# Patient Record
Sex: Female | Born: 1955 | Race: White | Hispanic: No | Smoking: Never smoker
Health system: Southern US, Community
[De-identification: ages and names within clinical notes are randomized; demographics above are authoritative.]

## PROBLEM LIST (undated history)

## (undated) DIAGNOSIS — E7800 Pure hypercholesterolemia, unspecified: Secondary | ICD-10-CM

## (undated) DIAGNOSIS — D649 Anemia, unspecified: Secondary | ICD-10-CM

## (undated) HISTORY — PX: WISDOM TOOTH EXTRACTION: SHX21

## (undated) HISTORY — DX: Pure hypercholesterolemia, unspecified: E78.00

## (undated) HISTORY — DX: Anemia, unspecified: D64.9

---

## 1997-07-12 ENCOUNTER — Other Ambulatory Visit: Admission: RE | Admit: 1997-07-12 | Discharge: 1997-07-12 | Payer: Self-pay | Admitting: Obstetrics and Gynecology

## 1998-08-10 ENCOUNTER — Other Ambulatory Visit: Admission: RE | Admit: 1998-08-10 | Discharge: 1998-08-10 | Payer: Self-pay | Admitting: Obstetrics and Gynecology

## 1999-08-15 ENCOUNTER — Other Ambulatory Visit: Admission: RE | Admit: 1999-08-15 | Discharge: 1999-08-15 | Payer: Self-pay | Admitting: Obstetrics and Gynecology

## 2000-08-21 ENCOUNTER — Other Ambulatory Visit: Admission: RE | Admit: 2000-08-21 | Discharge: 2000-08-21 | Payer: Self-pay | Admitting: Obstetrics and Gynecology

## 2002-09-29 ENCOUNTER — Other Ambulatory Visit: Admission: RE | Admit: 2002-09-29 | Discharge: 2002-09-29 | Payer: Self-pay | Admitting: Family Medicine

## 2003-11-28 ENCOUNTER — Other Ambulatory Visit: Admission: RE | Admit: 2003-11-28 | Discharge: 2003-11-28 | Payer: Self-pay | Admitting: Family Medicine

## 2004-05-06 ENCOUNTER — Ambulatory Visit: Payer: Self-pay | Admitting: Unknown Physician Specialty

## 2005-01-14 ENCOUNTER — Ambulatory Visit: Payer: Self-pay | Admitting: Family Medicine

## 2005-01-14 ENCOUNTER — Other Ambulatory Visit: Admission: RE | Admit: 2005-01-14 | Discharge: 2005-01-14 | Payer: Self-pay | Admitting: Family Medicine

## 2006-04-27 ENCOUNTER — Ambulatory Visit: Payer: Self-pay | Admitting: Family Medicine

## 2006-04-27 ENCOUNTER — Other Ambulatory Visit: Admission: RE | Admit: 2006-04-27 | Discharge: 2006-04-27 | Payer: Self-pay | Admitting: Family Medicine

## 2006-05-20 ENCOUNTER — Ambulatory Visit: Payer: Self-pay | Admitting: Family Medicine

## 2010-07-17 ENCOUNTER — Ambulatory Visit: Payer: Self-pay | Admitting: Unknown Physician Specialty

## 2010-07-25 ENCOUNTER — Ambulatory Visit: Payer: Self-pay | Admitting: Unknown Physician Specialty

## 2015-12-17 LAB — HM PAP SMEAR: HM Pap smear: NEGATIVE

## 2016-02-28 DIAGNOSIS — L821 Other seborrheic keratosis: Secondary | ICD-10-CM | POA: Diagnosis not present

## 2016-04-09 ENCOUNTER — Other Ambulatory Visit: Payer: Self-pay | Admitting: *Deleted

## 2016-04-09 ENCOUNTER — Encounter: Payer: Self-pay | Admitting: Podiatry

## 2016-04-09 ENCOUNTER — Ambulatory Visit (INDEPENDENT_AMBULATORY_CARE_PROVIDER_SITE_OTHER): Payer: BC Managed Care – PPO

## 2016-04-09 ENCOUNTER — Ambulatory Visit (INDEPENDENT_AMBULATORY_CARE_PROVIDER_SITE_OTHER): Payer: BC Managed Care – PPO | Admitting: Podiatry

## 2016-04-09 ENCOUNTER — Encounter: Payer: Self-pay | Admitting: *Deleted

## 2016-04-09 VITALS — BP 133/77 | HR 81 | Resp 16 | Ht 62.0 in | Wt 130.0 lb

## 2016-04-09 DIAGNOSIS — M79671 Pain in right foot: Secondary | ICD-10-CM

## 2016-04-09 DIAGNOSIS — M722 Plantar fascial fibromatosis: Secondary | ICD-10-CM

## 2016-04-09 DIAGNOSIS — M79672 Pain in left foot: Secondary | ICD-10-CM | POA: Diagnosis not present

## 2016-04-09 DIAGNOSIS — M205X1 Other deformities of toe(s) (acquired), right foot: Secondary | ICD-10-CM

## 2016-04-09 DIAGNOSIS — E785 Hyperlipidemia, unspecified: Secondary | ICD-10-CM | POA: Insufficient documentation

## 2016-04-09 MED ORDER — MELOXICAM 15 MG PO TABS: 15.0000 mg | ORAL_TABLET | Freq: Every day | ORAL | 3 refills | Status: DC

## 2016-04-09 NOTE — Progress Notes (Signed)
   Subjective:    Patient ID: Lindsay Randall, female    DOB: [birth year], 61 y.o.   MRN: [id number]  HPI: She presents today with a chief complaint of pain throughout the plantar aspect of the bilateral foot. She really can't pinpoint the pain but thinks it started in the heels. She states that the pain is in different areas and he may just be arthritis as I have arthritis everywhere. She states the more she walks the worse it gets she's tried arthritis creams no medications other than that. She does not like to take medications.    Review of Systems  Constitutional: Positive for chills and diaphoresis.  Genitourinary: Positive for frequency and urgency.  Musculoskeletal: Positive for arthralgias, back pain, gait problem and myalgias.  Neurological: Positive for dizziness.  All other systems reviewed and are negative.      Objective:   Physical Exam: Vital signs are stable alert and oriented 3 pulses are palpable. Neurologic sensorium is intact per Semmes-Weinstein monofilament. Deep tendon reflexes are intact. Muscle strength was 5 over 5 dorsiflexion plantar flexors and inverters and everters all into the musculature is intact. She has tenderness on palpation medial calcaneal tubercles bilaterally and limited range of motion of the first metatarsophalangeal joint of the right foot. She has tenderness on palpation of the first metatarsophalangeal joint of the right foot and attempted range of motion. Radiographs taken today do demonstrate soft tissue increase in density of the plantar fascia calcaneal insertion site of the bilateral heels. And also hallux rigidus first metatarsophalangeal joint. Joint space narrowing subchondral sclerosis and dorsal spurring is noted.        Assessment & Plan:  Assessment: Hallux rigidus first metatarsophalangeal joint and plantar fasciitis bilateral with compensatory syndrome.  Plan: Discussed etiology pathology conservative versus surgical therapies. I  offered her an injection and orthotics and she declined. We discussed the possible need for surgical intervention regarding the first metatarsophalangeal joint. I did talk with her extensively about taking an oral anti-inflammatory which she finally decided to do and we will start her on meloxicam 15 mg 1 by mouth daily 30 with 1 refill. Follow-up with her in 1 month.

## 2016-06-09 ENCOUNTER — Ambulatory Visit (INDEPENDENT_AMBULATORY_CARE_PROVIDER_SITE_OTHER): Payer: BC Managed Care – PPO | Admitting: Podiatry

## 2016-06-09 ENCOUNTER — Encounter: Payer: Self-pay | Admitting: Podiatry

## 2016-06-09 DIAGNOSIS — M779 Enthesopathy, unspecified: Secondary | ICD-10-CM

## 2016-06-09 DIAGNOSIS — M722 Plantar fascial fibromatosis: Secondary | ICD-10-CM | POA: Diagnosis not present

## 2016-06-09 MED ORDER — DICLOFENAC SODIUM 1 % TD GEL: 4.0000 g | Freq: Four times a day (QID) | TRANSDERMAL | 3 refills | Status: DC

## 2016-06-09 NOTE — Progress Notes (Signed)
She presents today for follow-up plantar foot pain beneath the second metatarsal stasis burning under the toe she's also following up for bilateral plantar fasciitis states they're better but they still bother me to some degree.  Objective: Vital signs are stable she's alert and oriented 3. Pulses are palpable. She has tennis on palpation medially continued to both bilateral heels and beneath the second metatarsophalangeal joint.  Assessment: Plantar fasciitis and capsulitis. Discussed appropriate shoe gear stretching exercise ice therapy issue modifications. Also discussed with her in great detail today about using diclofenac gel and she will start utilizing that. Follow up with me as needed.

## 2016-06-24 DIAGNOSIS — D509 Iron deficiency anemia, unspecified: Secondary | ICD-10-CM | POA: Diagnosis not present

## 2016-06-24 DIAGNOSIS — E784 Other hyperlipidemia: Secondary | ICD-10-CM | POA: Diagnosis not present

## 2016-06-24 DIAGNOSIS — Z0000 Encounter for general adult medical examination without abnormal findings: Secondary | ICD-10-CM | POA: Diagnosis not present

## 2016-08-07 ENCOUNTER — Other Ambulatory Visit: Payer: Self-pay

## 2016-08-07 MED ORDER — MELOXICAM 15 MG PO TABS: 15.0000 mg | ORAL_TABLET | Freq: Every day | ORAL | 3 refills | Status: DC

## 2016-08-07 NOTE — Progress Notes (Signed)
Pharmacy requesting refill authorization for Mobic.  Per Dr. Milinda Pointer, ok to refill x3    Rx has been sent to pharmacy

## 2016-11-11 DIAGNOSIS — M7042 Prepatellar bursitis, left knee: Secondary | ICD-10-CM | POA: Diagnosis not present

## 2016-12-04 ENCOUNTER — Telehealth: Payer: Self-pay

## 2016-12-04 MED ORDER — MELOXICAM 15 MG PO TABS: 15.0000 mg | ORAL_TABLET | Freq: Every day | ORAL | 6 refills | Status: DC

## 2016-12-04 NOTE — Telephone Encounter (Signed)
Pharmacy refill request for Meloxicam.  Per Dr. Milinda Pointer, refills have been sent back to pharmacy.

## 2016-12-15 DIAGNOSIS — M25562 Pain in left knee: Secondary | ICD-10-CM | POA: Diagnosis not present

## 2016-12-15 DIAGNOSIS — N951 Menopausal and female climacteric states: Secondary | ICD-10-CM | POA: Diagnosis not present

## 2017-02-26 ENCOUNTER — Ambulatory Visit: Payer: BC Managed Care – PPO | Admitting: Obstetrics and Gynecology

## 2017-03-02 DIAGNOSIS — Z1283 Encounter for screening for malignant neoplasm of skin: Secondary | ICD-10-CM | POA: Diagnosis not present

## 2017-03-02 DIAGNOSIS — S0501XA Injury of conjunctiva and corneal abrasion without foreign body, right eye, initial encounter: Secondary | ICD-10-CM | POA: Diagnosis not present

## 2017-03-02 DIAGNOSIS — D229 Melanocytic nevi, unspecified: Secondary | ICD-10-CM | POA: Diagnosis not present

## 2017-03-02 DIAGNOSIS — L720 Epidermal cyst: Secondary | ICD-10-CM | POA: Diagnosis not present

## 2017-03-06 ENCOUNTER — Ambulatory Visit (INDEPENDENT_AMBULATORY_CARE_PROVIDER_SITE_OTHER): Payer: BC Managed Care – PPO | Admitting: Obstetrics and Gynecology

## 2017-03-06 ENCOUNTER — Encounter: Payer: Self-pay | Admitting: Obstetrics and Gynecology

## 2017-03-06 VITALS — BP 120/80 | HR 78 | Ht 62.0 in | Wt 129.0 lb

## 2017-03-06 DIAGNOSIS — Z01419 Encounter for gynecological examination (general) (routine) without abnormal findings: Secondary | ICD-10-CM

## 2017-03-06 DIAGNOSIS — Z124 Encounter for screening for malignant neoplasm of cervix: Secondary | ICD-10-CM

## 2017-03-06 LAB — HM PAP SMEAR: HM Pap smear: NEGATIVE

## 2017-03-06 NOTE — Progress Notes (Signed)
Routine Annual Gynecology Examination   PCP: Maryland Pink, MD  Chief Complaint:  Chief Complaint  Patient presents with  . Gynecologic Exam    buring c peeing ?skin irritation    History of Present Illness: Patient is a 62 y.o. G1P1001 presents for annual exam. The patient has no complaints today.   Menopausal bleeding: denies  Menopausal symptoms: reports mild symptoms. Started Effexor and gets mild relief, but not as good as with hormones.   Breast symptoms: denies  Last pap smear: 1.5 years ago.  Result Normal  Last mammogram: April  2017 in [place] per patient (report not available) years ago.  Result Normal   Routine screening and vaccination maintenance by PCP and endocrinologist.   Past Medical History:  Diagnosis Date  . Anemia   . Hypercholesteremia     Past Surgical History:  Procedure Laterality Date  . WISDOM TOOTH EXTRACTION      Prior to Admission medications   Medication Sig Start Date End Date Taking? Authorizing Provider  ezetimibe-simvastatin (VYTORIN) 10-40 MG tablet Take 1 tablet by mouth daily. 02/04/16  Yes [provider]  venlafaxine XR (EFFEXOR-XR) 37.5 MG 24 hr capsule Take 1 capsule by mouth 2 (two) times daily. 02/09/17  Yes [provider]   Allergies: No Known Allergies  Obstetric History: G1P1001  Social History   Socioeconomic History  . Marital status: Married    Spouse name: Not on file  . Number of children: Not on file  . Years of education: Not on file  . Highest education level: Not on file  Social Needs  . Financial resource strain: Not on file  . Food insecurity - worry: Not on file  . Food insecurity - inability: Not on file  . Transportation needs - medical: Not on file  . Transportation needs - non-medical: Not on file  Occupational History  . Not on file  Tobacco Use  . Smoking status: Never Smoker  . Smokeless tobacco: Never Used  Substance and Sexual Activity  . Alcohol use: Yes      Comment: rare  . Drug use: No  . Sexual activity: No    Birth control/protection: Post-menopausal  Other Topics Concern  . Not on file  Social History Narrative  . Not on file    Family History  Problem Relation Age of Onset  . Diabetes Mellitus II Mother   . Heart disease Mother   . Multiple sclerosis Father     Review of Systems  Constitutional: Negative.   HENT: Negative.   Eyes: Negative.   Respiratory: Negative.   Cardiovascular: Negative.   Gastrointestinal: Negative.   Genitourinary: Negative.   Musculoskeletal: Negative.   Skin: Negative.   Neurological: Negative.   Psychiatric/Behavioral: Negative.      Physical Exam Vitals: BP 120/80   Pulse 78   Ht 5\' 2"  (1.575 m)   Wt 129 lb (58.5 kg)   BMI 23.59 kg/m   Physical Exam  Constitutional: She is oriented to person, place, and time. She appears well-developed and well-nourished. No distress.  Genitourinary: Uterus normal. Pelvic exam was performed with patient supine. There is no rash, tenderness, lesion or injury on the right labia. There is no rash, tenderness, lesion or injury on the left labia. No erythema, tenderness or bleeding in the vagina. No signs of injury around the vagina. No vaginal discharge found. Right adnexum does not display mass, does not display tenderness and does not display fullness. Left adnexum does not display mass,  does not display tenderness and does not display fullness. Cervix does not exhibit motion tenderness, lesion, discharge or polyp.   Uterus is mobile and anteverted. Uterus is not enlarged, tender or exhibiting a mass.  HENT:  Head: Normocephalic and atraumatic.  Eyes: EOM are normal. No scleral icterus.  Neck: Normal range of motion. Neck supple. No thyromegaly present.  Cardiovascular: Normal rate and regular rhythm. Exam reveals no gallop and no friction rub.  No murmur heard. Pulmonary/Chest: Effort normal and breath sounds normal. No respiratory distress. She has no  wheezes. She has no rales. Right breast exhibits no inverted nipple, no mass, no nipple discharge, no skin change and no tenderness. Left breast exhibits no inverted nipple, no mass, no nipple discharge, no skin change and no tenderness.  Abdominal: Soft. Bowel sounds are normal. She exhibits no distension and no mass. There is no tenderness. There is no rebound and no guarding.  Musculoskeletal: Normal range of motion. She exhibits no edema or tenderness.  Lymphadenopathy:    She has no cervical adenopathy.       Right: No inguinal adenopathy present.       Left: No inguinal adenopathy present.  Neurological: She is alert and oriented to person, place, and time. No cranial nerve deficit.  Skin: Skin is warm and dry. No rash noted. No erythema.  Psychiatric: She has a normal mood and affect. Her behavior is normal. Judgment normal.   Female chaperone present for pelvic and breast  portions of the physical exam   Assessment and Plan:  62 y.o. G21P1001 female here for routine annual gynecologic examination  Plan: Problem List Items Addressed This Visit    None      Screening: -- Blood pressure screen normal -- Colonoscopy - per PCP -- Mammogram - not due. patient to schedule in April. I have requested that the patient ask [facility] to send me a copy of the report since I perform her breast exams -- Weight screening: normal -- Depression screening negative (PHQ-9) -- Nutrition: normal -- cholesterol screening: per PCP -- osteoporosis screening: not due -- tobacco screening: not using -- alcohol screening: AUDIT questionnaire indicates low-risk usage. -- family history of breast cancer screening: done. not at high risk. -- no evidence of domestic violence or intimate partner violence. -- STD screening: gonorrhea/chlamydia NAAT not collected per patient request. -- pap smear collected per ASCCP guidelines -- HPV vaccination series: not eligilbe  Prentice Docker, MD 03/06/2017 2:37 PM

## 2017-03-10 LAB — IGP, APTIMA HPV, RFX 16/18,45
HPV Aptima: NEGATIVE
PAP Smear Comment: 0

## 2017-06-04 DIAGNOSIS — H9319 Tinnitus, unspecified ear: Secondary | ICD-10-CM | POA: Diagnosis not present

## 2017-06-04 DIAGNOSIS — H903 Sensorineural hearing loss, bilateral: Secondary | ICD-10-CM | POA: Diagnosis not present

## 2017-06-30 ENCOUNTER — Encounter: Payer: Self-pay | Admitting: Obstetrics and Gynecology

## 2017-06-30 DIAGNOSIS — M1990 Unspecified osteoarthritis, unspecified site: Secondary | ICD-10-CM | POA: Diagnosis not present

## 2017-06-30 DIAGNOSIS — Z1389 Encounter for screening for other disorder: Secondary | ICD-10-CM | POA: Diagnosis not present

## 2017-06-30 DIAGNOSIS — N951 Menopausal and female climacteric states: Secondary | ICD-10-CM | POA: Diagnosis not present

## 2017-06-30 DIAGNOSIS — E7849 Other hyperlipidemia: Secondary | ICD-10-CM | POA: Diagnosis not present

## 2017-10-06 DIAGNOSIS — H40003 Preglaucoma, unspecified, bilateral: Secondary | ICD-10-CM | POA: Diagnosis not present

## 2018-06-23 ENCOUNTER — Emergency Department: Payer: BC Managed Care – PPO

## 2018-06-23 ENCOUNTER — Other Ambulatory Visit: Payer: Self-pay

## 2018-06-23 ENCOUNTER — Emergency Department
Admission: EM | Admit: 2018-06-23 | Discharge: 2018-06-23 | Disposition: A | Discharge status: Home / Self Care | Payer: BC Managed Care – PPO | Attending: Emergency Medicine | Admitting: Emergency Medicine

## 2018-06-23 DIAGNOSIS — Z79899 Other long term (current) drug therapy: Secondary | ICD-10-CM | POA: Insufficient documentation

## 2018-06-23 DIAGNOSIS — M5126 Other intervertebral disc displacement, lumbar region: Secondary | ICD-10-CM

## 2018-06-23 DIAGNOSIS — M5431 Sciatica, right side: Secondary | ICD-10-CM

## 2018-06-23 DIAGNOSIS — M545 Low back pain: Secondary | ICD-10-CM | POA: Diagnosis present

## 2018-06-23 MED ORDER — PREDNISONE 50 MG PO TABS: 50.0000 mg | ORAL_TABLET | Freq: Every day | ORAL | 0 refills | Status: DC

## 2018-06-23 MED ORDER — PREDNISONE 20 MG PO TABS
60.0000 mg | ORAL_TABLET | Freq: Once | ORAL | Status: AC
  Administered 2018-06-23: 60 mg via ORAL
  Filled 2018-06-23: qty 3

## 2018-06-23 MED ORDER — METHOCARBAMOL 500 MG PO TABS: 500.0000 mg | ORAL_TABLET | Freq: Four times a day (QID) | ORAL | 0 refills | Status: DC

## 2018-06-23 NOTE — ED Notes (Signed)
Report off to [employer]

## 2018-06-23 NOTE — ED Triage Notes (Signed)
Pt reporting lower back pain, greater on right than left. Pt was doing stretching in bed this AM, unsure if related. Causing patient to limp with walking. Pt alert and oriented X4, active, cooperative, pt in NAD. RR even and unlabored, color WNL.

## 2018-06-23 NOTE — ED Provider Notes (Addendum)
[facility] Emergency Department Provider Note  ____________________________________________  Time seen: Approximately 5:08 PM  I have reviewed the triage vital signs and the nursing notes.   HISTORY  Chief Complaint Back Pain    HPI Lindsay Randall is a 63 y.o. female who presents emergency department complaining of vague pain to her lower back and right hip.  Patient reports that this morning she was performing some stretches while in bed before getting up.  She did not have any acute pain or injury but states that when she went to a standing position she had low back and right hip pain.  Pain locations have been slightly migraines but have been focused between her lumbar spine and hip region.  Patient has had no bowel or bladder dysfunction, saddle anesthesia, paresthesias.  She reports that she has a history of degenerative disc disease but no other significant history of back problems.  Patient denies any urinary or GI problems.  No other complaints this time.  No medications prior to arrival.         Past Medical History:  Diagnosis Date  . Anemia   . Hypercholesteremia     Patient Active Problem List   Diagnosis Date Noted  . Hyperlipidemia, unspecified 04/09/2016    Past Surgical History:  Procedure Laterality Date  . WISDOM TOOTH EXTRACTION      Prior to Admission medications   Medication Sig Start Date End Date Taking? Authorizing Provider  ezetimibe-simvastatin (VYTORIN) 10-40 MG tablet Take 1 tablet by mouth daily. 02/04/16   [provider]  methocarbamol (ROBAXIN) 500 MG tablet Take 1 tablet (500 mg total) by mouth 4 (four) times daily. 06/23/18   Elysse Polidore, Charline Bills, PA-C  predniSONE (DELTASONE) 50 MG tablet Take 1 tablet (50 mg total) by mouth daily with breakfast. 06/23/18   Caydance Kuehnle, Charline Bills, PA-C  venlafaxine XR (EFFEXOR-XR) 37.5 MG 24 hr capsule Take 1 capsule by mouth 2 (two) times daily. 02/09/17   [provider]    Allergies Patient has no known allergies.  Family History  Problem Relation Age of Onset  . Diabetes Mellitus II Mother   . Heart disease Mother   . Multiple sclerosis Father     Social History Social History   Tobacco Use  . Smoking status: Never Smoker  . Smokeless tobacco: Never Used  Substance Use Topics  . Alcohol use: Yes    Comment: rare  . Drug use: No     Review of Systems  Constitutional: No fever/chills Eyes: No visual changes. No discharge ENT: No upper respiratory complaints. Cardiovascular: no chest pain. Respiratory: no cough. No SOB. Gastrointestinal: No abdominal pain.  No nausea, no vomiting.  No diarrhea.  No constipation. Genitourinary: Negative for dysuria. No hematuria Musculoskeletal: Positive for lower back/right hip pain Skin: Negative for rash, abrasions, lacerations, ecchymosis. Neurological: Negative for headaches, focal weakness or numbness. 10-point ROS otherwise negative.  ____________________________________________   PHYSICAL EXAM:  VITAL SIGNS: ED Triage Vitals  Enc Vitals Group     BP 06/23/18 1653 (!) 156/95     Pulse Rate 06/23/18 1653 80     Resp 06/23/18 1653 16     Temp 06/23/18 1653 97.7 F (36.5 C)     Temp Source 06/23/18 1653 Oral     SpO2 06/23/18 1653 96 %     Weight 06/23/18 1651 130 lb (59 kg)     Height 06/23/18 1651 5\' 2"  (1.575 m)     Head Circumference --  Peak Flow --      Pain Score 06/23/18 1650 7     Pain Loc --      Pain Edu? --      Excl. in [place]? --      Constitutional: Alert and oriented. Well appearing and in no acute distress. Eyes: Conjunctivae are normal. PERRL. EOMI. Head: Atraumatic. Neck: No stridor.    Cardiovascular: Normal rate, regular rhythm. Normal S1 and S2.  Good peripheral circulation. Respiratory: Normal respiratory effort without tachypnea or retractions. Lungs CTAB. Good air entry to the bases with no decreased or absent breath  sounds. Gastrointestinal: Bowel sounds 4 quadrants. Soft and nontender to palpation. No guarding or rigidity. No palpable masses. No distention. No CVA tenderness. Musculoskeletal: Full range of motion to all extremities. No gross deformities appreciated.  No acute visible abnormality to the lumbar spine or right hip.  Patient is able to flex, extend, rotate the lower lumbar region.  Patient has good range of motion to the right hip.  Patient is diffusely, mildly tender to palpation throughout the sciatic nerve distribution starting in the lumbar spine radiating to the lateral hip.  No point specific tenderness.  No palpable abnormality.  Dorsalis pedis pulse intact distally.  Sensation intact distally. Neurologic:  Normal speech and language. No gross focal neurologic deficits are appreciated.  Skin:  Skin is warm, dry and intact. No rash noted. Psychiatric: Mood and affect are normal. Speech and behavior are normal. Patient exhibits appropriate insight and judgement.   ____________________________________________   LABS (all labs ordered are listed, but only abnormal results are displayed)  Labs Reviewed - No data to display ____________________________________________  EKG   ____________________________________________  RADIOLOGY I personally viewed and evaluated these images as part of my medical decision making, as well as reviewing the written report by the radiologist.  I concur with mild degenerative disc disease but no acute osseous abnormality  Dg Lumbar Spine 2-3 Views  Result Date: 06/23/2018 CLINICAL DATA:  Low back pain beginning this morning while exercising. Initial encounter. EXAM: LUMBAR SPINE - 2-3 VIEW COMPARISON:  None. FINDINGS: There is no evidence of lumbar spine fracture. Alignment is normal. Mild degenerative disc disease seen at L4-5 and L5-S1. No focal bone lesions identified. IMPRESSION: No acute findings. Mild lower lumbar degenerative disc disease.  Electronically Signed   By: Earle Gell M.D.   On: 06/23/2018 18:11   Mr Lumbar Spine Wo Contrast  Result Date: 06/23/2018 CLINICAL DATA:  Low back and right hip pain worsening today. EXAM: MRI LUMBAR SPINE WITHOUT CONTRAST TECHNIQUE: Multiplanar, multisequence MR imaging of the lumbar spine was performed. No intravenous contrast was administered. COMPARISON:  Radiography same day FINDINGS: Segmentation:  5 lumbar type vertebral bodies. Alignment:  Normal Vertebrae:  No fracture or primary bone lesion. Conus medullaris and cauda equina: Conus extends to the T12-L1 level. Conus and cauda equina appear normal. Paraspinal and other soft tissues: Negative Disc levels: No abnormality at L1-2 or above. L2-3: Mild bulging of the disc. Mild facet hypertrophy. No compressive stenosis. L3-4: Mild bulging of the disc. Mild facet and ligamentous hypertrophy. No compressive stenosis. L4-5: Moderate broad-based disc herniation slightly more prominent towards the right, with a small amount of disc material migrated upward into the foramen on the right. This could affect the right L4 nerve and also the right L5 nerve in the lateral recess. L5-S1: Chronic disc degeneration with loss of disc height. No canal or foraminal stenosis. IMPRESSION: Probably acute disc herniation at L4-5 more  prominent towards the right with a small amount of disc material migrated upward into the intervertebral foramen on the right. This would have potential to affect the right L4 nerve in the foramen in the right L5 nerve in the lateral recess. Electronically Signed   By: Nelson Chimes M.D.   On: 06/23/2018 20:00   Dg Hip Unilat W Or Wo Pelvis 2-3 Views Right  Result Date: 06/23/2018 CLINICAL DATA:  Right hip pain beginning today while exercising. Initial encounter. EXAM: DG HIP (WITH OR WITHOUT PELVIS) 2-3V RIGHT COMPARISON:  None. FINDINGS: There is no evidence of hip fracture or dislocation. There is no evidence of arthropathy or other focal  bone abnormality. IMPRESSION: Negative. Electronically Signed   By: Earle Gell M.D.   On: 06/23/2018 18:09    ____________________________________________    PROCEDURES  Procedure(s) performed:    Procedures    Medications  predniSONE (DELTASONE) tablet 60 mg (60 mg Oral Given 06/23/18 1836)     ____________________________________________   INITIAL IMPRESSION / ASSESSMENT AND PLAN / ED COURSE  Pertinent labs & imaging results that were available during my care of the patient were reviewed by me and considered in my medical decision making (see chart for details).  Review of the [place] CSRS was performed in accordance of the [place] prior to dispensing any controlled drugs.           Patient's diagnosis is consistent with sciatica.  Patient presented to emergency department with nonspecific lower back and right hip pain.  On exam, differential included fracture, lumbar radiculopathy, herniated disc, sciatica.  Findings are most consistent with sciatica with reassuring imaging.. Patient will be discharged home with prescriptions for prednisone and Robaxin. Patient is to follow up with primary care as needed or otherwise directed. Patient is given ED precautions to return to the ED for any worsening or new symptoms.  ----------------------------------------- 6:54 PM on 06/23/2018 -----------------------------------------  Patient was evaluated with imaging, given steroid emergency department, upper discharge.  Patient had been laying in the bed stating that her pain had been improving since she had arrived.  Patient went to stand up and was unable to support herself on the right lower extremity.  When standing to sign for discharge, patient's right leg buckled, causing her to partially collapsed.  She did not directly fall but was forced to sit down.  At this time, I reevaluated patient, she does have weakness to the right lower leg when compared with left.  I attempted to stand  patient with support and she is unable to support herself in the right lower leg.  This is changed from when she arrived in the emergency department.  With pain distribution, no findings of right leg weakness, patient will be evaluated with MRI of her lumbar spine.  ----------------------------------------- 8:29 PM on 06/23/2018 -----------------------------------------  MRI returns with evidence of herniated disc at the L4-L5 area.  This does appear that is impinging on the foramen which likely explains patient's increased symptoms and weakness in the right lower extremity.  After dose of steroids earlier in the emergency department, patient is walked in the department at this time with improved strength in the right lower extremity.  At this time that will feel patient needs an emergent neurosurgery consult.  I discussed concerning signs and symptoms to be mindful of and the patient verbalizes understanding that she will return for any of the symptoms.  At this time, patient will be referred to neurosurgery for follow-up.  Same medications as  before with muscle relaxer and steroid.  Patient verbalizes that she will follow-up with neurosurgery or return to the emergency department for any sudden changes, new, or worsening symptoms.    ____________________________________________  FINAL CLINICAL IMPRESSION(S) / ED DIAGNOSES  Final diagnoses:  Sciatica of right side      NEW MEDICATIONS STARTED DURING THIS VISIT:  ED Discharge Orders         Ordered    predniSONE (DELTASONE) 50 MG tablet  Daily with breakfast     06/23/18 1829    methocarbamol (ROBAXIN) 500 MG tablet  4 times daily     06/23/18 1829              This chart was dictated using voice recognition software/Dragon. Despite best efforts to proofread, errors can occur which can change the meaning. Any change was purely unintentional.    Darletta Moll, PA-C 06/23/18 1829    Linda Grimmer, Charline Bills,  PA-C 06/23/18 2047    Nance Pear, MD 06/23/18 2102

## 2018-06-23 NOTE — ED Notes (Signed)
Pt reports doing stretches in bed this am.   Pt felt pain in right lower back/hip/buttock.  Pt states pain worse this afternoon.  Pt alert.

## 2018-06-23 NOTE — ED Notes (Signed)
Patient to MRI.

## 2018-06-23 NOTE — ED Notes (Addendum)
Pt was being discharged and states leg gave out and she sat down on floor but did not fall.  Pa-c cuthriell in room to see pt again. Pt denies dizziness, chest pain or sob.   Pt alert  Speech clear.  No loc pt did not hit her head.  Pt lying down on stretcher in no acute distress.

## 2018-07-13 ENCOUNTER — Encounter: Payer: Self-pay | Admitting: Obstetrics and Gynecology

## 2018-08-23 ENCOUNTER — Ambulatory Visit (INDEPENDENT_AMBULATORY_CARE_PROVIDER_SITE_OTHER): Payer: BC Managed Care – PPO | Admitting: Obstetrics and Gynecology

## 2018-08-23 ENCOUNTER — Other Ambulatory Visit: Payer: Self-pay

## 2018-08-23 ENCOUNTER — Encounter: Payer: Self-pay | Admitting: Obstetrics and Gynecology

## 2018-08-23 VITALS — BP 122/74 | Ht 62.0 in | Wt 130.0 lb

## 2018-08-23 DIAGNOSIS — Z1339 Encounter for screening examination for other mental health and behavioral disorders: Secondary | ICD-10-CM

## 2018-08-23 DIAGNOSIS — Z01419 Encounter for gynecological examination (general) (routine) without abnormal findings: Secondary | ICD-10-CM | POA: Diagnosis not present

## 2018-08-23 DIAGNOSIS — Z1331 Encounter for screening for depression: Secondary | ICD-10-CM

## 2018-08-23 NOTE — Progress Notes (Signed)
Routine Annual Gynecology Examination   PCP: Maryland Pink, MD  Chief Complaint  Patient presents with  . Annual Exam    History of Present Illness: Patient is a 63 y.o. G1P1001 presents for annual exam. The patient has no complaints today.   Menopausal bleeding: denies  Menopausal symptoms: reports hot and cold flashes.   Breast symptoms: denies  Last pap smear: 1 year ago.  Result Normal  Last mammogram: 1 month ago.  Result Normal   Last Colonoscopy: She sees Dr. Kary Kos and Dr. Forde Dandy in [place] at [facility].  She states that she is up to date.    Past Medical History:  Diagnosis Date  . Anemia   . Hypercholesteremia     Past Surgical History:  Procedure Laterality Date  . WISDOM TOOTH EXTRACTION      Prior to Admission medications   Medication Sig Start Date End Date Taking? Authorizing Provider  ezetimibe-simvastatin (VYTORIN) 10-40 MG tablet Take 1 tablet by mouth daily. 02/04/16  Yes [provider]   Allergies: No Known Allergies  Obstetric History: G1P1001  Social History   Socioeconomic History  . Marital status: Married    Spouse name: Not on file  . Number of children: Not on file  . Years of education: Not on file  . Highest education level: Not on file  Occupational History  . Not on file  Social Needs  . Financial resource strain: Not on file  . Food insecurity    Worry: Not on file    Inability: Not on file  . Transportation needs    Medical: Not on file    Non-medical: Not on file  Tobacco Use  . Smoking status: Never Smoker  . Smokeless tobacco: Never Used  Substance and Sexual Activity  . Alcohol use: Yes    Comment: rare  . Drug use: No  . Sexual activity: Never    Birth control/protection: Post-menopausal  Lifestyle  . Physical activity    Days per week: Not on file    Minutes per session: Not on file  . Stress: Not on file  Relationships  . Social Herbalist on phone: Not on  file    Gets together: Not on file    Attends religious service: Not on file    Active member of club or organization: Not on file    Attends meetings of clubs or organizations: Not on file    Relationship status: Not on file  . Intimate partner violence    Fear of current or ex partner: Not on file    Emotionally abused: Not on file    Physically abused: Not on file    Forced sexual activity: Not on file  Other Topics Concern  . Not on file  Social History Narrative  . Not on file    Family History  Problem Relation Age of Onset  . Diabetes Mellitus II Mother   . Heart disease Mother   . Multiple sclerosis Father     Review of Systems  Constitutional: Negative.   HENT: Negative.   Eyes: Negative.   Respiratory: Negative.   Cardiovascular: Negative.   Gastrointestinal: Negative.   Genitourinary: Negative.   Musculoskeletal: Negative.   Skin: Negative.   Neurological: Negative.   Psychiatric/Behavioral: Negative.      Physical Exam Vitals: BP 122/74   Ht 5\' 2"  (1.575 m)   Wt 130 lb (59 kg)   BMI 23.78 kg/m   Physical Exam  Constitutional:      General: She is not in acute distress.    Appearance: Normal appearance. She is well-developed.  Genitourinary:     Pelvic exam was performed with patient in the lithotomy position.     Vulva, urethra, bladder and uterus normal.     No inguinal adenopathy present in the right or left side.    Urethral caruncle present.     No signs of injury in the vagina.     No vaginal discharge, erythema, tenderness or bleeding.     No cervical motion tenderness, discharge, lesion or polyp.     Uterus is mobile.     Uterus is not enlarged or tender.     No uterine mass detected.    Uterus is anteverted.     No right or left adnexal mass present.     Right adnexa not tender or full.     Left adnexa not tender or full.  HENT:     Head: Normocephalic and atraumatic.  Eyes:     General: No scleral icterus.    Conjunctiva/sclera:  Conjunctivae normal.  Neck:     Musculoskeletal: Normal range of motion and neck supple.     Thyroid: No thyromegaly.  Cardiovascular:     Rate and Rhythm: Normal rate and regular rhythm.     Heart sounds: No murmur. No friction rub. No gallop.   Pulmonary:     Effort: Pulmonary effort is normal. No respiratory distress.     Breath sounds: Normal breath sounds. No wheezing or rales.  Chest:     Breasts:        Right: No inverted nipple, mass, nipple discharge, skin change or tenderness.        Left: No inverted nipple, mass, nipple discharge, skin change or tenderness.  Abdominal:     General: Bowel sounds are normal. There is no distension.     Palpations: Abdomen is soft. There is no mass.     Tenderness: There is no abdominal tenderness. There is no guarding or rebound.  Musculoskeletal: Normal range of motion.        General: No swelling or tenderness.  Lymphadenopathy:     Cervical: No cervical adenopathy.     Upper Body:     Right upper body: No supraclavicular, axillary or pectoral adenopathy.     Left upper body: No supraclavicular, axillary or pectoral adenopathy.     Lower Body: No right inguinal adenopathy. No left inguinal adenopathy.  Neurological:     General: No focal deficit present.     Mental Status: She is alert and oriented to person, place, and time.     Cranial Nerves: No cranial nerve deficit.  Skin:    General: Skin is warm and dry.     Findings: No erythema or rash.  Psychiatric:        Mood and Affect: Mood normal.        Behavior: Behavior normal.        Judgment: Judgment normal.    Female chaperone present for pelvic and breast  portions of the physical exam  Results: AUDIT Questionnaire (screen for alcoholism): 1 PHQ-9: 3   Assessment and Plan:  63 y.o. G67P1001 female here for routine annual gynecologic examination  Plan: Problem List Items Addressed This Visit    None    Visit Diagnoses    Women's annual routine gynecological  examination    -  Primary   Screening for depression  Screening for alcoholism          Screening: -- Blood pressure screen normal -- Colonoscopy - not due -- Mammogram - done in May of this year.  -- Weight screening: normal -- Depression screening negative (PHQ-9) -- Nutrition: normal -- cholesterol screening: per PCP -- osteoporosis screening: not due -- tobacco screening: not using -- alcohol screening: AUDIT questionnaire indicates low-risk usage. -- family history of breast cancer screening: done. not at high risk. -- no evidence of domestic violence or intimate partner violence. -- STD screening: gonorrhea/chlamydia NAAT not collected per patient request. -- pap smear not collected per ASCCP guidelines -- HPV vaccination series: not eligilbe   Prentice Docker, MD 08/23/2018 1:52 PM

## 2018-10-12 DIAGNOSIS — M6281 Muscle weakness (generalized): Secondary | ICD-10-CM | POA: Diagnosis not present

## 2018-10-12 DIAGNOSIS — R29898 Other symptoms and signs involving the musculoskeletal system: Secondary | ICD-10-CM | POA: Diagnosis not present

## 2018-10-19 DIAGNOSIS — R29898 Other symptoms and signs involving the musculoskeletal system: Secondary | ICD-10-CM | POA: Diagnosis not present

## 2018-10-21 DIAGNOSIS — R29898 Other symptoms and signs involving the musculoskeletal system: Secondary | ICD-10-CM | POA: Diagnosis not present

## 2018-10-26 DIAGNOSIS — R29898 Other symptoms and signs involving the musculoskeletal system: Secondary | ICD-10-CM | POA: Diagnosis not present

## 2018-10-28 DIAGNOSIS — R29898 Other symptoms and signs involving the musculoskeletal system: Secondary | ICD-10-CM | POA: Diagnosis not present

## 2018-11-02 DIAGNOSIS — R29898 Other symptoms and signs involving the musculoskeletal system: Secondary | ICD-10-CM | POA: Diagnosis not present

## 2018-11-04 DIAGNOSIS — R29898 Other symptoms and signs involving the musculoskeletal system: Secondary | ICD-10-CM | POA: Diagnosis not present

## 2018-11-09 DIAGNOSIS — M5416 Radiculopathy, lumbar region: Secondary | ICD-10-CM | POA: Diagnosis not present

## 2019-03-24 DIAGNOSIS — R238 Other skin changes: Secondary | ICD-10-CM | POA: Diagnosis not present

## 2019-03-24 DIAGNOSIS — L814 Other melanin hyperpigmentation: Secondary | ICD-10-CM | POA: Diagnosis not present

## 2019-03-24 DIAGNOSIS — Z1283 Encounter for screening for malignant neoplasm of skin: Secondary | ICD-10-CM | POA: Diagnosis not present

## 2019-03-24 DIAGNOSIS — D1800 Hemangioma unspecified site: Secondary | ICD-10-CM | POA: Diagnosis not present

## 2019-03-24 DIAGNOSIS — L821 Other seborrheic keratosis: Secondary | ICD-10-CM | POA: Diagnosis not present

## 2019-03-24 DIAGNOSIS — D225 Melanocytic nevi of trunk: Secondary | ICD-10-CM | POA: Diagnosis not present

## 2019-03-24 DIAGNOSIS — L578 Other skin changes due to chronic exposure to nonionizing radiation: Secondary | ICD-10-CM | POA: Diagnosis not present

## 2019-07-19 DIAGNOSIS — Z1231 Encounter for screening mammogram for malignant neoplasm of breast: Secondary | ICD-10-CM | POA: Diagnosis not present

## 2019-07-27 DIAGNOSIS — R232 Flushing: Secondary | ICD-10-CM | POA: Diagnosis not present

## 2019-07-27 DIAGNOSIS — R32 Unspecified urinary incontinence: Secondary | ICD-10-CM | POA: Diagnosis not present

## 2019-07-27 DIAGNOSIS — E785 Hyperlipidemia, unspecified: Secondary | ICD-10-CM | POA: Diagnosis not present

## 2019-07-27 DIAGNOSIS — E559 Vitamin D deficiency, unspecified: Secondary | ICD-10-CM | POA: Diagnosis not present

## 2019-07-27 DIAGNOSIS — R159 Full incontinence of feces: Secondary | ICD-10-CM | POA: Diagnosis not present

## 2019-08-24 ENCOUNTER — Other Ambulatory Visit: Payer: Self-pay

## 2019-08-24 ENCOUNTER — Encounter: Payer: Self-pay | Admitting: Obstetrics and Gynecology

## 2019-08-24 ENCOUNTER — Ambulatory Visit (INDEPENDENT_AMBULATORY_CARE_PROVIDER_SITE_OTHER): Payer: PPO | Admitting: Obstetrics and Gynecology

## 2019-08-24 VITALS — BP 122/74 | Ht 62.0 in | Wt 128.0 lb

## 2019-08-24 DIAGNOSIS — Z1331 Encounter for screening for depression: Secondary | ICD-10-CM

## 2019-08-24 DIAGNOSIS — Z01419 Encounter for gynecological examination (general) (routine) without abnormal findings: Secondary | ICD-10-CM

## 2019-08-24 DIAGNOSIS — N951 Menopausal and female climacteric states: Secondary | ICD-10-CM | POA: Diagnosis not present

## 2019-08-24 DIAGNOSIS — Z1339 Encounter for screening examination for other mental health and behavioral disorders: Secondary | ICD-10-CM | POA: Diagnosis not present

## 2019-08-24 MED ORDER — ESTRADIOL 0.1 MG/GM VA CREA: 1.0000 | TOPICAL_CREAM | VAGINAL | 3 refills | Status: AC

## 2019-08-24 NOTE — Progress Notes (Signed)
Routine Annual Gynecology Examination   PCP: Maryland Pink, MD  Chief Complaint  Patient presents with  . Annual Exam  . Hot Flashes    History of Present Illness: Patient is a 64 y.o. G1P1001 presents for annual exam. The patient has no complaints today.   Menopausal bleeding: denies  Menopausal symptoms: vaginal itching and burning.  She tried treating for an infection and this might help for a short while, then the symptoms come back.  She continues to have hot and cold flashes.    Breast symptoms: denies  Last pap smear: 2 years ago.  Result Normal  Last mammogram: 07/19/2019.  Result Normal, per patient report.    Last Colonoscopy: Sees Dr. Kary Kos and Dr. Forde Dandy in [place] at [facility]. States she is up to date.    She has normal bowel movements apart from the fact that she occasionally will have a small amount of fecal incontinence. She has tried [employer] and this seems to have helped. She is working with her PCP.   Past Medical History:  Diagnosis Date  . Anemia   . Hypercholesteremia     Past Surgical History:  Procedure Laterality Date  . WISDOM TOOTH EXTRACTION      Prior to Admission medications   Medication Sig Start Date End Date Taking? Authorizing Provider  ezetimibe (ZETIA) 10 MG tablet Take 10 mg by mouth daily. 07/07/19   [provider]  ezetimibe-simvastatin (VYTORIN) 10-40 MG tablet Take 1 tablet by mouth daily. 02/04/16   [provider]  simvastatin (ZOCOR) 40 MG tablet Take 40 mg by mouth daily. 07/07/19   [provider]   Allergies: No Known Allergies  Obstetric History: G1P1001  Social History   Socioeconomic History  . Marital status: Married    Spouse name: Not on file  . Number of children: Not on file  . Years of education: Not on file  . Highest education level: Not on file  Occupational History  . Not on file  Tobacco Use  . Smoking status: Never Smoker  . Smokeless tobacco:  Never Used  Vaping Use  . Vaping Use: Never used  Substance and Sexual Activity  . Alcohol use: Yes    Comment: rare  . Drug use: No  . Sexual activity: Never    Birth control/protection: Post-menopausal  Other Topics Concern  . Not on file  Social History Narrative  . Not on file   Social Determinants of Health   Financial Resource Strain:   . Difficulty of Paying Living Expenses:   Food Insecurity:   . Worried About Charity fundraiser in the Last Year:   . Arboriculturist in the Last Year:   Transportation Needs:   . Film/video editor (Medical):   Marland Kitchen Lack of Transportation (Non-Medical):   Physical Activity:   . Days of Exercise per Week:   . Minutes of Exercise per Session:   Stress:   . Feeling of Stress :   Social Connections:   . Frequency of Communication with Friends and Family:   . Frequency of Social Gatherings with Friends and Family:   . Attends Religious Services:   . Active Member of Clubs or Organizations:   . Attends Archivist Meetings:   Marland Kitchen Marital Status:   Intimate Partner Violence:   . Fear of Current or Ex-Partner:   . Emotionally Abused:   Marland Kitchen Physically Abused:   . Sexually Abused:     Family  History  Problem Relation Age of Onset  . Diabetes Mellitus II Mother   . Heart disease Mother   . Multiple sclerosis Father     Review of Systems  Constitutional: Negative.        Hot flashes  HENT: Negative.   Eyes: Negative.   Respiratory: Negative.   Cardiovascular: Negative.   Gastrointestinal: Negative.   Genitourinary: Positive for frequency. Negative for dysuria, flank pain, hematuria and urgency.       Difficulty holding urine  Musculoskeletal: Positive for joint pain. Negative for back pain, falls, myalgias and neck pain.  Skin: Negative.   Neurological: Negative.   Psychiatric/Behavioral: Negative.      Physical Exam Vitals: BP 122/74   Ht 5\' 2"  (1.575 m)   Wt 128 lb (58.1 kg)   BMI 23.41 kg/m   Physical  Exam Constitutional:      General: She is not in acute distress.    Appearance: Normal appearance. She is well-developed.  Genitourinary:     Pelvic exam was performed with patient in the lithotomy position.     Vulva, urethra, bladder and uterus normal.     No inguinal adenopathy present in the right or left side.    No signs of injury in the vagina.     No vaginal discharge, erythema, tenderness or bleeding.     No cervical motion tenderness, discharge, lesion or polyp.     Uterus is mobile.     Uterus is not enlarged or tender.     No uterine mass detected.    Uterus is anteverted.     No right or left adnexal mass present.     Right adnexa not tender or full.     Left adnexa not tender or full.  HENT:     Head: Normocephalic and atraumatic.  Eyes:     General: No scleral icterus.    Conjunctiva/sclera: Conjunctivae normal.  Neck:     Thyroid: No thyromegaly.  Cardiovascular:     Rate and Rhythm: Normal rate and regular rhythm.     Heart sounds: No murmur heard.  No friction rub. No gallop.   Pulmonary:     Effort: Pulmonary effort is normal. No respiratory distress.     Breath sounds: Normal breath sounds. No wheezing or rales.  Chest:     Breasts:        Right: No inverted nipple, mass, nipple discharge, skin change or tenderness.        Left: No inverted nipple, mass, nipple discharge, skin change or tenderness.  Abdominal:     General: Bowel sounds are normal. There is no distension.     Palpations: Abdomen is soft. There is no mass.     Tenderness: There is no abdominal tenderness. There is no guarding or rebound.  Musculoskeletal:        General: No swelling or tenderness. Normal range of motion.     Cervical back: Normal range of motion and neck supple.  Lymphadenopathy:     Cervical: No cervical adenopathy.     Lower Body: No right inguinal adenopathy. No left inguinal adenopathy.  Neurological:     General: No focal deficit present.     Mental Status: She  is alert and oriented to person, place, and time.     Cranial Nerves: No cranial nerve deficit.  Skin:    General: Skin is warm and dry.     Findings: No erythema or rash.  Psychiatric:  Mood and Affect: Mood normal.        Behavior: Behavior normal.        Judgment: Judgment normal.      Female chaperone present for pelvic and breast  portions of the physical exam  Results: AUDIT Questionnaire (screen for alcoholism): 1 PHQ-9: 3   Assessment and Plan:  64 y.o. G51P1001 female here for routine annual gynecologic examination  Plan: Problem List Items Addressed This Visit    None    Visit Diagnoses    Women's annual routine gynecological examination    -  Primary   Relevant Medications   estradiol (ESTRACE VAGINAL) 0.1 MG/GM vaginal cream (Start on 08/25/2019)   Screening for depression       Screening for alcoholism       Menopausal vaginal dryness       Relevant Medications   estradiol (ESTRACE VAGINAL) 0.1 MG/GM vaginal cream (Start on 08/25/2019)      Screening: -- Blood pressure screen normal -- Colonoscopy - per PCP -- Mammogram - recently accomplished. No report available. This was recently performed at [facility]. -- Weight screening: normal -- Depression screening negative (PHQ-9) -- Nutrition: normal -- cholesterol screening: per PCP -- osteoporosis screening: per PCP -- tobacco screening: not using -- alcohol screening: AUDIT questionnaire indicates low-risk usage. -- family history of breast cancer screening: done. not at high risk. -- no evidence of domestic violence or intimate partner violence. -- STD screening: gonorrhea/chlamydia NAAT not collected per patient request. -- pap smear not collected per ASCCP guidelines  S/p COVID19 vaccine x 2  Prentice Docker, MD 08/24/2019 10:26 AM

## 2019-09-19 DIAGNOSIS — R232 Flushing: Secondary | ICD-10-CM | POA: Diagnosis not present

## 2019-09-19 DIAGNOSIS — R32 Unspecified urinary incontinence: Secondary | ICD-10-CM | POA: Diagnosis not present

## 2019-09-19 DIAGNOSIS — R159 Full incontinence of feces: Secondary | ICD-10-CM | POA: Diagnosis not present

## 2019-09-19 DIAGNOSIS — E559 Vitamin D deficiency, unspecified: Secondary | ICD-10-CM | POA: Diagnosis not present

## 2019-09-19 DIAGNOSIS — E785 Hyperlipidemia, unspecified: Secondary | ICD-10-CM | POA: Diagnosis not present

## 2019-09-26 DIAGNOSIS — Z23 Encounter for immunization: Secondary | ICD-10-CM | POA: Diagnosis not present

## 2019-09-26 DIAGNOSIS — R159 Full incontinence of feces: Secondary | ICD-10-CM | POA: Diagnosis not present

## 2019-09-26 DIAGNOSIS — D751 Secondary polycythemia: Secondary | ICD-10-CM | POA: Diagnosis not present

## 2019-09-26 DIAGNOSIS — E785 Hyperlipidemia, unspecified: Secondary | ICD-10-CM | POA: Diagnosis not present

## 2019-09-26 DIAGNOSIS — Z0000 Encounter for general adult medical examination without abnormal findings: Secondary | ICD-10-CM | POA: Diagnosis not present

## 2019-10-26 DIAGNOSIS — D751 Secondary polycythemia: Secondary | ICD-10-CM | POA: Diagnosis not present

## 2019-11-08 DIAGNOSIS — M4726 Other spondylosis with radiculopathy, lumbar region: Secondary | ICD-10-CM | POA: Diagnosis not present

## 2019-11-08 DIAGNOSIS — M5416 Radiculopathy, lumbar region: Secondary | ICD-10-CM | POA: Diagnosis not present

## 2019-11-08 DIAGNOSIS — M5117 Intervertebral disc disorders with radiculopathy, lumbosacral region: Secondary | ICD-10-CM | POA: Diagnosis not present

## 2019-11-08 DIAGNOSIS — M5116 Intervertebral disc disorders with radiculopathy, lumbar region: Secondary | ICD-10-CM | POA: Diagnosis not present

## 2019-11-08 DIAGNOSIS — R159 Full incontinence of feces: Secondary | ICD-10-CM | POA: Diagnosis not present

## 2019-11-08 DIAGNOSIS — R32 Unspecified urinary incontinence: Secondary | ICD-10-CM | POA: Diagnosis not present

## 2019-11-09 ENCOUNTER — Other Ambulatory Visit: Payer: Self-pay | Admitting: Nurse Practitioner

## 2019-11-09 DIAGNOSIS — M5416 Radiculopathy, lumbar region: Secondary | ICD-10-CM

## 2019-11-09 DIAGNOSIS — R159 Full incontinence of feces: Secondary | ICD-10-CM

## 2019-11-09 DIAGNOSIS — R32 Unspecified urinary incontinence: Secondary | ICD-10-CM

## 2019-11-26 ENCOUNTER — Ambulatory Visit
Admission: RE | Admit: 2019-11-26 | Discharge: 2019-11-26 | Disposition: A | Discharge status: Home / Self Care | Payer: PPO | Source: Ambulatory Visit | Attending: Nurse Practitioner | Admitting: Nurse Practitioner

## 2019-11-26 DIAGNOSIS — M5416 Radiculopathy, lumbar region: Secondary | ICD-10-CM

## 2019-11-26 DIAGNOSIS — R32 Unspecified urinary incontinence: Secondary | ICD-10-CM | POA: Diagnosis not present

## 2019-11-26 DIAGNOSIS — M545 Low back pain, unspecified: Secondary | ICD-10-CM | POA: Diagnosis not present

## 2019-11-26 DIAGNOSIS — R159 Full incontinence of feces: Secondary | ICD-10-CM | POA: Insufficient documentation

## 2020-01-24 DIAGNOSIS — H43393 Other vitreous opacities, bilateral: Secondary | ICD-10-CM | POA: Diagnosis not present

## 2020-03-26 ENCOUNTER — Encounter: Payer: BC Managed Care – PPO | Admitting: Dermatology

## 2020-05-21 ENCOUNTER — Other Ambulatory Visit: Payer: Self-pay

## 2020-05-21 ENCOUNTER — Ambulatory Visit (INDEPENDENT_AMBULATORY_CARE_PROVIDER_SITE_OTHER): Payer: PPO | Admitting: Dermatology

## 2020-05-21 DIAGNOSIS — L820 Inflamed seborrheic keratosis: Secondary | ICD-10-CM | POA: Diagnosis not present

## 2020-05-21 DIAGNOSIS — L578 Other skin changes due to chronic exposure to nonionizing radiation: Secondary | ICD-10-CM

## 2020-05-21 DIAGNOSIS — D2271 Melanocytic nevi of right lower limb, including hip: Secondary | ICD-10-CM | POA: Diagnosis not present

## 2020-05-21 DIAGNOSIS — L814 Other melanin hyperpigmentation: Secondary | ICD-10-CM | POA: Diagnosis not present

## 2020-05-21 DIAGNOSIS — Z1283 Encounter for screening for malignant neoplasm of skin: Secondary | ICD-10-CM | POA: Diagnosis not present

## 2020-05-21 DIAGNOSIS — L821 Other seborrheic keratosis: Secondary | ICD-10-CM | POA: Diagnosis not present

## 2020-05-21 DIAGNOSIS — D229 Melanocytic nevi, unspecified: Secondary | ICD-10-CM | POA: Diagnosis not present

## 2020-05-21 DIAGNOSIS — D1800 Hemangioma unspecified site: Secondary | ICD-10-CM

## 2020-05-21 NOTE — Patient Instructions (Signed)

## 2020-05-21 NOTE — Progress Notes (Signed)
   Follow-Up Visit   Subjective  Lindsay Randall is a 65 y.o. female who presents for the following: Annual Exam. The patient presents for Total-Body Skin Exam (TBSE) for skin cancer screening and mole check. Patient has a lesion on her right clavicle that she is concerned about - crusted, bleeds. She has also noticed bumps on the backs of her arms that occasionally itch and comes and goes.   The following portions of the chart were reviewed this encounter and updated as appropriate:   Tobacco  Allergies  Meds  Problems  Med Hx  Surg Hx  Fam Hx     Review of Systems:  No other skin or systemic complaints except as noted in HPI or Assessment and Plan.  Objective  Well appearing patient in no apparent distress; mood and affect are within normal limits.  A full examination was performed including scalp, head, eyes, ears, nose, lips, neck, chest, axillae, abdomen, back, buttocks, bilateral upper extremities, bilateral lower extremities, hands, feet, fingers, toes, fingernails, and toenails. All findings within normal limits unless otherwise noted below.  Objective  R medial clavicle x 1: Erythematous keratotic or waxy stuck-on papule or plaque.   Objective  R plantar surface: Regular brown macule 0.3 cm   Images      Assessment & Plan  Inflamed seborrheic keratosis R medial clavicle x 1 Destruction of lesion - R medial clavicle x 1 Complexity: simple   Destruction method: cryotherapy   Informed consent: discussed and consent obtained   Timeout:  patient name, date of birth, surgical site, and procedure verified Lesion destroyed using liquid nitrogen: Yes   Region frozen until ice ball extended beyond lesion: Yes   Outcome: patient tolerated procedure well with no complications   Post-procedure details: wound care instructions given    Nevus 0.3 cm R plantar surface See photo. Benign-appearing.  Observation.  Call clinic for new or changing lesions.  Recommend daily  use of broad spectrum spf 30+ sunscreen to sun-exposed areas.   Lentigines - Scattered tan macules - Due to sun exposure - Benign-appering, observe - Recommend daily broad spectrum sunscreen SPF 30+ to sun-exposed areas, reapply every 2 hours as needed. - Call for any changes  Seborrheic Keratoses - Stuck-on, waxy, tan-brown papules and/or plaques  - Benign-appearing - Discussed benign etiology and prognosis. - Observe - Call for any changes  Melanocytic Nevi - Tan-brown and/or pink-flesh-colored symmetric macules and papules - Benign appearing on exam today - Observation - Call clinic for new or changing moles - Recommend daily use of broad spectrum spf 30+ sunscreen to sun-exposed areas.   Hemangiomas - Red papules - Discussed benign nature - Observe - Call for any changes  Actinic Damage - Chronic condition, secondary to cumulative UV/sun exposure - diffuse scaly erythematous macules with underlying dyspigmentation - Recommend daily broad spectrum sunscreen SPF 30+ to sun-exposed areas, reapply every 2 hours as needed.  - Staying in the shade or wearing long sleeves, sun glasses (UVA+UVB protection) and wide brim hats (4-inch brim around the entire circumference of the hat) are also recommended for sun protection.  - Call for new or changing lesions.  Skin cancer screening performed today.  Return in about 1 year (around 05/21/2021) for TBSE.  Luther Redo, CMA, am acting as scribe for Sarina Ser, MD .  Documentation: I have reviewed the above documentation for accuracy and completeness, and I agree with the above.  Sarina Ser, MD

## 2020-05-23 ENCOUNTER — Encounter: Payer: Self-pay | Admitting: Dermatology

## 2020-07-24 DIAGNOSIS — Z1231 Encounter for screening mammogram for malignant neoplasm of breast: Secondary | ICD-10-CM | POA: Diagnosis not present

## 2020-08-06 DIAGNOSIS — R928 Other abnormal and inconclusive findings on diagnostic imaging of breast: Secondary | ICD-10-CM | POA: Diagnosis not present

## 2020-08-06 DIAGNOSIS — R921 Mammographic calcification found on diagnostic imaging of breast: Secondary | ICD-10-CM | POA: Diagnosis not present

## 2020-09-21 DIAGNOSIS — R159 Full incontinence of feces: Secondary | ICD-10-CM | POA: Diagnosis not present

## 2020-09-21 DIAGNOSIS — E785 Hyperlipidemia, unspecified: Secondary | ICD-10-CM | POA: Diagnosis not present

## 2020-09-21 DIAGNOSIS — D751 Secondary polycythemia: Secondary | ICD-10-CM | POA: Diagnosis not present

## 2020-09-21 DIAGNOSIS — Z20822 Contact with and (suspected) exposure to covid-19: Secondary | ICD-10-CM | POA: Diagnosis not present

## 2020-09-26 DIAGNOSIS — Z124 Encounter for screening for malignant neoplasm of cervix: Secondary | ICD-10-CM | POA: Diagnosis not present

## 2020-09-26 DIAGNOSIS — Z1211 Encounter for screening for malignant neoplasm of colon: Secondary | ICD-10-CM | POA: Diagnosis not present

## 2020-09-26 DIAGNOSIS — N952 Postmenopausal atrophic vaginitis: Secondary | ICD-10-CM | POA: Diagnosis not present

## 2020-09-26 DIAGNOSIS — E785 Hyperlipidemia, unspecified: Secondary | ICD-10-CM | POA: Diagnosis not present

## 2020-09-26 DIAGNOSIS — Z780 Asymptomatic menopausal state: Secondary | ICD-10-CM | POA: Diagnosis not present

## 2020-09-26 DIAGNOSIS — Z0000 Encounter for general adult medical examination without abnormal findings: Secondary | ICD-10-CM | POA: Diagnosis not present

## 2020-09-26 DIAGNOSIS — E611 Iron deficiency: Secondary | ICD-10-CM | POA: Diagnosis not present

## 2020-09-27 DIAGNOSIS — E611 Iron deficiency: Secondary | ICD-10-CM | POA: Diagnosis not present

## 2020-10-30 DIAGNOSIS — R5383 Other fatigue: Secondary | ICD-10-CM | POA: Diagnosis not present

## 2020-10-30 DIAGNOSIS — E611 Iron deficiency: Secondary | ICD-10-CM | POA: Diagnosis not present

## 2020-12-27 DIAGNOSIS — Z780 Asymptomatic menopausal state: Secondary | ICD-10-CM | POA: Diagnosis not present

## 2020-12-27 DIAGNOSIS — Z13820 Encounter for screening for osteoporosis: Secondary | ICD-10-CM | POA: Diagnosis not present

## 2021-01-29 DIAGNOSIS — H2513 Age-related nuclear cataract, bilateral: Secondary | ICD-10-CM | POA: Diagnosis not present

## 2021-02-13 DIAGNOSIS — R051 Acute cough: Secondary | ICD-10-CM | POA: Diagnosis not present

## 2021-02-13 DIAGNOSIS — J0100 Acute maxillary sinusitis, unspecified: Secondary | ICD-10-CM | POA: Diagnosis not present

## 2021-03-06 ENCOUNTER — Encounter: Payer: Self-pay | Admitting: Gastroenterology

## 2021-03-06 NOTE — H&P (Signed)
Pre-Procedure H&P   Patient ID: Lindsay Randall is a 66 y.o. female.  Gastroenterology Provider: Annamaria Helling, DO  Referring Provider: Dr. Kary Randall PCP: Lindsay Pink, MD  Date: 03/07/2021  HPI [name] is a 66 y.o. female who presents today for Colonoscopy for screening colonoscopy. BM regular w/o blood or melena. Bowel movements are frequently loose. Hgb 13.9; mcv 81 plt 332 iron sat 22% No fhx crc or polyps; 2012 Colonoscopy with no polyps; + IH  No other acute gi complaints.   Past Medical History:  Diagnosis Date   Anemia    Hypercholesteremia     Past Surgical History:  Procedure Laterality Date   WISDOM TOOTH EXTRACTION      Family History No h/o GI disease or malignancy  Review of Systems  Constitutional:  Negative for activity change, appetite change, chills, diaphoresis, fatigue, fever and unexpected weight change.  HENT:  Negative for trouble swallowing and voice change.   Respiratory:  Negative for shortness of breath and wheezing.   Cardiovascular:  Negative for chest pain, palpitations and leg swelling.  Gastrointestinal:  Negative for abdominal distention, abdominal pain, anal bleeding, blood in stool, constipation, diarrhea, nausea, rectal pain and vomiting.  Musculoskeletal:  Negative for arthralgias and myalgias.  Skin:  Negative for color change and pallor.  Neurological:  Negative for dizziness, syncope and weakness.  Psychiatric/Behavioral:  Negative for confusion.   All other systems reviewed and are negative.   Medications No current facility-administered medications on file prior to encounter.   Current Outpatient Medications on File Prior to Encounter  Medication Sig Dispense Refill   ezetimibe (ZETIA) 10 MG tablet Take 10 mg by mouth daily.     Multiple Vitamins-Minerals (CENTRUM SILVER 50+WOMEN PO) Take by mouth.     simvastatin (ZOCOR) 40 MG tablet Take 40 mg by mouth daily.     vitamin B-12 (CYANOCOBALAMIN) 1000  MCG tablet Take 1,000 mcg by mouth daily.     estradiol (ESTRACE VAGINAL) 0.1 MG/GM vaginal cream Place 1 Applicatorful vaginally 2 (two) times a week. (Patient not taking: Reported on 05/21/2020) 42.5 g 3    Pertinent medications related to GI and procedure were reviewed by me with the patient prior to the procedure   Current Facility-Administered Medications:    0.9 %  sodium chloride infusion, , Intravenous, Continuous, Annamaria Helling, DO, Last Rate: 20 mL/hr at 03/07/21 0753, 20 mL/hr at 03/07/21 0753      No Known Allergies Allergies were reviewed by me prior to the procedure  Objective    Vitals:   03/07/21 0744  BP: (!) 127/92  Pulse: 82  Resp: 20  Temp: (!) 97.2 F (36.2 C)  TempSrc: Temporal  SpO2: 100%  Weight: 59 kg  Height: 5\' 2"  (1.575 m)     Physical Exam Vitals and nursing note reviewed.  Constitutional:      General: She is not in acute distress.    Appearance: Normal appearance. She is not ill-appearing, toxic-appearing or diaphoretic.  HENT:     Head: Normocephalic and atraumatic.     Nose: Nose normal.     Mouth/Throat:     Mouth: Mucous membranes are moist.     Pharynx: Oropharynx is clear.  Eyes:     General: No scleral icterus.    Extraocular Movements: Extraocular movements intact.  Cardiovascular:     Rate and Rhythm: Normal rate and regular rhythm.     Heart sounds: Normal heart sounds. No murmur heard.  No friction rub. No gallop.  Pulmonary:     Effort: Pulmonary effort is normal. No respiratory distress.     Breath sounds: Normal breath sounds. No wheezing, rhonchi or rales.  Abdominal:     General: Abdomen is flat. Bowel sounds are normal. There is no distension.     Palpations: Abdomen is soft.     Tenderness: There is no abdominal tenderness. There is no guarding or rebound.  Musculoskeletal:     Cervical back: Neck supple.     Right lower leg: No edema.     Left lower leg: No edema.  Skin:    General: Skin is warm  and dry.     Coloration: Skin is not jaundiced or pale.  Neurological:     General: No focal deficit present.     Mental Status: She is alert and oriented to person, place, and time. Mental status is at baseline.  Psychiatric:        Mood and Affect: Mood normal.        Behavior: Behavior normal.        Thought Content: Thought content normal.        Judgment: Judgment normal.    Assessment:  [name] is a 66 y.o. female  who presents today for Colonoscopy for screening colonoscopy.  Plan:  Colonoscopy with possible intervention today  Colonoscopy with possible biopsy, control of bleeding, polypectomy, and interventions as necessary has been discussed with the patient/patient representative. Informed consent was obtained from the patient/patient representative after explaining the indication, nature, and risks of the procedure including but not limited to death, bleeding, perforation, missed neoplasm/lesions, cardiorespiratory compromise, and reaction to medications. Opportunity for questions was given and appropriate answers were provided. Patient/patient representative has verbalized understanding is amenable to undergoing the procedure.   Annamaria Helling, DO  [facility]  Portions of the record may have been created with voice recognition software. Occasional wrong-word or 'sound-a-like' substitutions may have occurred due to the inherent limitations of voice recognition software.  Read the chart carefully and recognize, using context, where substitutions may have occurred.

## 2021-03-07 ENCOUNTER — Ambulatory Visit: Payer: PPO | Admitting: Registered Nurse

## 2021-03-07 ENCOUNTER — Encounter: Payer: Self-pay | Admitting: Gastroenterology

## 2021-03-07 ENCOUNTER — Encounter
Admission: RE | Disposition: A | Discharge status: Home / Self Care | Payer: Self-pay | Source: Home / Self Care | Attending: Gastroenterology

## 2021-03-07 ENCOUNTER — Ambulatory Visit
Admission: RE | Admit: 2021-03-07 | Discharge: 2021-03-07 | Disposition: A | Discharge status: Home / Self Care | Payer: PPO | Attending: Gastroenterology | Admitting: Gastroenterology

## 2021-03-07 DIAGNOSIS — K640 First degree hemorrhoids: Secondary | ICD-10-CM | POA: Diagnosis not present

## 2021-03-07 DIAGNOSIS — K5520 Angiodysplasia of colon without hemorrhage: Secondary | ICD-10-CM | POA: Insufficient documentation

## 2021-03-07 DIAGNOSIS — Z1211 Encounter for screening for malignant neoplasm of colon: Secondary | ICD-10-CM | POA: Diagnosis not present

## 2021-03-07 HISTORY — PX: COLONOSCOPY WITH PROPOFOL: SHX5780

## 2021-03-07 SURGERY — COLONOSCOPY WITH PROPOFOL
Anesthesia: General

## 2021-03-07 MED ORDER — PROPOFOL 500 MG/50ML IV EMUL
INTRAVENOUS | Status: AC
  Filled 2021-03-07: qty 50

## 2021-03-07 MED ORDER — PROPOFOL 10 MG/ML IV BOLUS
INTRAVENOUS | Status: DC | PRN
  Administered 2021-03-07: 80 mg via INTRAVENOUS

## 2021-03-07 MED ORDER — LIDOCAINE HCL (CARDIAC) PF 100 MG/5ML IV SOSY
PREFILLED_SYRINGE | INTRAVENOUS | Status: DC | PRN
  Administered 2021-03-07: 40 mg via INTRAVENOUS

## 2021-03-07 MED ORDER — PROPOFOL 500 MG/50ML IV EMUL
INTRAVENOUS | Status: DC | PRN
  Administered 2021-03-07: 150 ug/kg/min via INTRAVENOUS

## 2021-03-07 MED ORDER — SODIUM CHLORIDE 0.9 % IV SOLN
INTRAVENOUS | Status: DC
  Administered 2021-03-07: 20 mL/h via INTRAVENOUS

## 2021-03-07 NOTE — Op Note (Signed)
[facility] Patient Name: Lindsay Randall Procedure Date: 03/07/2021 8:31 AM MRN: [id number] Account #: [id number] Date of Birth: 1955-10-20 Admit Type: Outpatient Age: 66 Room: [facility] ENDO ROOM 1 Gender: Female Note Status: Finalized Instrument Name: Colonoscope [id number] Procedure:             Colonoscopy Indications:           Screening for colorectal malignant neoplasm Providers:             Rueben Bash, DO Referring MD:          Irven Easterly. Kary Kos, MD (Referring MD) Medicines:             Monitored Anesthesia Care Complications:         No immediate complications. Estimated blood loss: None. Procedure:             Pre-Anesthesia Assessment:                        - Prior to the procedure, a History and Physical was                         performed, and patient medications and allergies were                         reviewed. The patient is competent. The risks and                         benefits of the procedure and the sedation options and                         risks were discussed with the patient. All questions                         were answered and informed consent was obtained.                         Patient identification and proposed procedure were                         verified by the physician, the nurse, the anesthetist                         and the technician in the endoscopy suite. Mental                         Status Examination: alert and oriented. Airway                         Examination: normal oropharyngeal airway and neck                         mobility. Respiratory Examination: clear to                         auscultation. CV Examination: RRR, no murmurs, no S3                         or S4. Prophylactic Antibiotics: The patient does not  require prophylactic antibiotics. Prior                         Anticoagulants: The patient has taken no previous                          anticoagulant or antiplatelet agents. ASA Grade                         Assessment: II - A patient with mild systemic disease.                         After reviewing the risks and benefits, the patient                         was deemed in satisfactory condition to undergo the                         procedure. The anesthesia plan was to use monitored                         anesthesia care (MAC). Immediately prior to                         administration of medications, the patient was                         re-assessed for adequacy to receive sedatives. The                         heart rate, respiratory rate, oxygen saturations,                         blood pressure, adequacy of pulmonary ventilation, and                         response to care were monitored throughout the                         procedure. The physical status of the patient was                         re-assessed after the procedure.                        After obtaining informed consent, the colonoscope was                         passed under direct vision. Throughout the procedure,                         the patient's blood pressure, pulse, and oxygen                         saturations were monitored continuously. The                         Colonoscope was introduced through the anus and  advanced to the the terminal ileum, with                         identification of the appendiceal orifice and IC                         valve. The colonoscopy was performed without                         difficulty. The patient tolerated the procedure well.                         The quality of the bowel preparation was evaluated                         using the BBPS [facility] Bowel Preparation Scale) with                         scores of: Right Colon = 3, Transverse Colon = 3 and                         Left Colon = 3 (entire mucosa seen well with no                         residual staining,  small fragments of stool or opaque                         liquid). The total BBPS score equals 9. The terminal                         ileum, ileocecal valve, appendiceal orifice, and                         rectum were photographed. Findings:      The perianal and digital rectal examinations were normal. Pertinent       negatives include normal sphincter tone.      The terminal ileum appeared normal. Estimated blood loss: none.      A single medium-sized localized angioectasia without bleeding was found       in the cecum. Estimated blood loss: none.      Non-bleeding internal hemorrhoids were found during retroflexion. The       hemorrhoids were Grade I (internal hemorrhoids that do not prolapse).       Estimated blood loss: none.      The exam was otherwise without abnormality on direct and retroflexion       views. Impression:            - The examined portion of the ileum was normal.                        - A single non-bleeding colonic angioectasia.                        - Non-bleeding internal hemorrhoids.                        - The examination was otherwise normal on direct and  retroflexion views.                        - No specimens collected. Recommendation:        - Discharge patient to home.                        - Resume previous diet.                        - Continue present medications.                        - Repeat colonoscopy in 10 years for screening                         purposes.                        - Return to referring physician as previously                         scheduled. Procedure Code(s):     --- Professional ---                        [id number], Colonoscopy, flexible; diagnostic, including                         collection of specimen(s) by brushing or washing, when                         performed (separate procedure) Diagnosis Code(s):     --- Professional ---                        Z12.11, Encounter for screening  for malignant neoplasm                         of colon                        K64.0, First degree hemorrhoids                        K55.20, Angiodysplasia of colon without hemorrhage CPT copyright 2019 American Medical Association. All rights reserved. The codes documented in this report are preliminary and upon coder review may  be revised to meet current compliance requirements. Attending Participation:      I personally performed the entire procedure. Volney American, DO Annamaria Helling DO, DO 03/07/2021 9:15:37 AM This report has been signed electronically. Number of Addenda: 0 Note Initiated On: 03/07/2021 8:31 AM Scope Withdrawal Time: 0 hours 13 minutes 21 seconds  Total Procedure Duration: 0 hours 22 minutes 10 seconds  Estimated Blood Loss:  Estimated blood loss: none.      [facility]

## 2021-03-07 NOTE — Anesthesia Preprocedure Evaluation (Signed)
Anesthesia Evaluation  Patient identified by MRN, date of birth, ID band Patient awake    Reviewed: Allergy & Precautions, H&P , NPO status , Patient's Chart, lab work & pertinent test results, reviewed documented beta blocker date and time   History of Anesthesia Complications Negative for: history of anesthetic complications  Airway Mallampati: III  TM Distance: >3 FB Neck ROM: full    Dental  (+) Dental Advidsory Given, Caps, Teeth Intact   Pulmonary neg pulmonary ROS,    Pulmonary exam normal breath sounds clear to auscultation       Cardiovascular Exercise Tolerance: Good negative cardio ROS Normal cardiovascular exam Rhythm:regular Rate:Normal     Neuro/Psych negative neurological ROS  negative psych ROS   GI/Hepatic negative GI ROS, Neg liver ROS,   Endo/Other  negative endocrine ROS  Renal/GU negative Renal ROS  negative genitourinary   Musculoskeletal   Abdominal   Peds  Hematology negative hematology ROS (+)   Anesthesia Other Findings Past Medical History: No date: Anemia No date: Hypercholesteremia   Reproductive/Obstetrics negative OB ROS                             Anesthesia Physical Anesthesia Plan  ASA: 2  Anesthesia Plan: General   Post-op Pain Management:    Induction: Intravenous  PONV Risk Score and Plan: 3 and Propofol infusion and TIVA  Airway Management Planned: Natural Airway and Nasal Cannula  Additional Equipment:   Intra-op Plan:   Post-operative Plan:   Informed Consent: I have reviewed the patients History and Physical, chart, labs and discussed the procedure including the risks, benefits and alternatives for the proposed anesthesia with the patient or authorized representative who has indicated his/her understanding and acceptance.     Dental Advisory Given  Plan Discussed with: Anesthesiologist, CRNA and Surgeon  Anesthesia Plan  Comments:         Anesthesia Quick Evaluation

## 2021-03-07 NOTE — Interval H&P Note (Signed)
History and Physical Interval Note: Preprocedure H&P from 03/07/21  was reviewed and there was no interval change after seeing and examining the patient.  Written consent was obtained from the patient after discussion of risks, benefits, and alternatives. Patient has consented to proceed with Colonoscopy with possible intervention   03/07/2021 8:40 AM  Modesta Messing  has presented today for surgery, with the diagnosis of Screening.  The various methods of treatment have been discussed with the patient and family. After consideration of risks, benefits and other options for treatment, the patient has consented to  Procedure(s): COLONOSCOPY WITH PROPOFOL (N/A) as a surgical intervention.  The patient's history has been reviewed, patient examined, no change in status, stable for surgery.  I have reviewed the patient's chart and labs.  Questions were answered to the patient's satisfaction.     Annamaria Helling

## 2021-03-07 NOTE — Transfer of Care (Signed)
Immediate Anesthesia Transfer of Care Note  Patient: Lindsay Randall  Procedure(s) Performed: COLONOSCOPY WITH PROPOFOL  Patient Location: PACU and Endoscopy Unit  Anesthesia Type:General  Level of Consciousness: drowsy and patient cooperative  Airway & Oxygen Therapy: Patient Spontanous Breathing  Post-op Assessment: Report given to RN and Post -op Vital signs reviewed and stable  Post vital signs: Reviewed and stable  Last Vitals:  Vitals Value Taken Time  BP 92/53   Temp    Pulse 65 03/07/21 0918  Resp 17 03/07/21 0918  SpO2 97 % 03/07/21 0918  Vitals shown include unvalidated device data.  Last Pain:  Vitals:   03/07/21 0744  TempSrc: Temporal  PainSc: 0-No pain         Complications: No notable events documented.

## 2021-03-07 NOTE — Anesthesia Procedure Notes (Signed)
Date/Time: 03/07/2021 8:42 AM Performed by: Doreen Salvage, CRNA Pre-anesthesia Checklist: Patient identified, Emergency Drugs available, Suction available and Patient being monitored Patient Re-evaluated:Patient Re-evaluated prior to induction Oxygen Delivery Method: Nasal cannula Induction Type: IV induction Dental Injury: Teeth and Oropharynx as per pre-operative assessment  Comments: Nasal cannula with etCO2 monitoring

## 2021-03-08 ENCOUNTER — Encounter: Payer: Self-pay | Admitting: Gastroenterology

## 2021-03-09 NOTE — Anesthesia Postprocedure Evaluation (Signed)
Anesthesia Post Note  Patient: ALMARIE KURDZIEL  Procedure(s) Performed: COLONOSCOPY WITH PROPOFOL  Patient location during evaluation: Endoscopy Anesthesia Type: General Level of consciousness: awake and alert Pain management: pain level controlled Vital Signs Assessment: post-procedure vital signs reviewed and stable Respiratory status: spontaneous breathing, nonlabored ventilation, respiratory function stable and patient connected to nasal cannula oxygen Cardiovascular status: blood pressure returned to baseline and stable Postop Assessment: no apparent nausea or vomiting Anesthetic complications: no   No notable events documented.   Last Vitals:  Vitals:   03/07/21 0940 03/07/21 0950  BP: 126/85 (!) 146/86  Pulse: 65   Resp: 16   Temp:    SpO2: 100% 100%    Last Pain:  Vitals:   03/08/21 0738  TempSrc:   PainSc: 0-No pain                 Martha Clan

## 2021-05-23 ENCOUNTER — Ambulatory Visit (INDEPENDENT_AMBULATORY_CARE_PROVIDER_SITE_OTHER): Payer: PPO | Admitting: Dermatology

## 2021-05-23 DIAGNOSIS — L578 Other skin changes due to chronic exposure to nonionizing radiation: Secondary | ICD-10-CM | POA: Diagnosis not present

## 2021-05-23 DIAGNOSIS — D1800 Hemangioma unspecified site: Secondary | ICD-10-CM | POA: Diagnosis not present

## 2021-05-23 DIAGNOSIS — Z1283 Encounter for screening for malignant neoplasm of skin: Secondary | ICD-10-CM

## 2021-05-23 DIAGNOSIS — L821 Other seborrheic keratosis: Secondary | ICD-10-CM

## 2021-05-23 DIAGNOSIS — D2271 Melanocytic nevi of right lower limb, including hip: Secondary | ICD-10-CM | POA: Diagnosis not present

## 2021-05-23 DIAGNOSIS — L814 Other melanin hyperpigmentation: Secondary | ICD-10-CM

## 2021-05-23 DIAGNOSIS — D229 Melanocytic nevi, unspecified: Secondary | ICD-10-CM

## 2021-05-23 DIAGNOSIS — L720 Epidermal cyst: Secondary | ICD-10-CM

## 2021-05-23 NOTE — Patient Instructions (Signed)

## 2021-05-23 NOTE — Progress Notes (Signed)
? ?  Follow-Up Visit ?  ?Subjective  ?Lindsay Randall is a 66 y.o. female who presents for the following: Annual Exam (No history of skin cancer or abnormal moles - TBSE today). ?The patient presents for Total-Body Skin Exam (TBSE) for skin cancer screening and mole check.  The patient has spots, moles and lesions to be evaluated, some may be new or changing and the patient has concerns that these could be cancer. ? ?The following portions of the chart were reviewed this encounter and updated as appropriate:  ? Tobacco  Allergies  Meds  Problems  Med Hx  Surg Hx  Fam Hx   ?  ?Review of Systems:  No other skin or systemic complaints except as noted in HPI or Assessment and Plan. ? ?Objective  ?Well appearing patient in no apparent distress; mood and affect are within normal limits. ? ?A full examination was performed including scalp, head, eyes, ears, nose, lips, neck, chest, axillae, abdomen, back, buttocks, bilateral upper extremities, bilateral lower extremities, hands, feet, fingers, toes, fingernails, and toenails. All findings within normal limits unless otherwise noted below. ? ?Face ?Smooth white papule(s).  ? ?Right Plantar Surface ?0.3 cm light brown macule ? ? ?Assessment & Plan  ? ?Lentigines ?- Scattered tan macules ?- Due to sun exposure ?- Benign-appearing, observe ?- Recommend daily broad spectrum sunscreen SPF 30+ to sun-exposed areas, reapply every 2 hours as needed. ?- Call for any changes ? ?Seborrheic Keratoses ?- Stuck-on, waxy, tan-brown papules and/or plaques  ?- Benign-appearing ?- Discussed benign etiology and prognosis. ?- Observe ?- Call for any changes ?- Discuused treatment with LN2. Advised patient fee is $60 for first lesion and $15 for each additional. ? ?Melanocytic Nevi ?- Tan-brown and/or pink-flesh-colored symmetric macules and papules ?- Benign appearing on exam today ?- Observation ?- Call clinic for new or changing moles ?- Recommend daily use of broad spectrum spf 30+  sunscreen to sun-exposed areas.  ? ?Hemangiomas ?- Red papules ?- Discussed benign nature ?- Observe ?- Call for any changes ? 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  ?- Staying in the shade or wearing long sleeves, sun glasses (UVA+UVB protection) and wide brim hats (4-inch brim around the entire circumference of the hat) are also recommended for sun protection.  ?- Call for new or changing lesions. ? ?Skin cancer screening performed today. ? ?Milia ?Face ?Aklief cream qd to spot treat area - samples given ? ?Nevus ?Right Plantar Surface ?0.3 cm regular light brown macule ?Benign-appearing.  Observation.  Call clinic for new or changing lesions.  Recommend daily use of broad spectrum spf 30+ sunscreen to sun-exposed areas.  ? ?Skin cancer screening ? ?Return in about 1 year (around 05/24/2022) for TBSE. ? ?I, Ashok Cordia, CMA, am acting as scribe for Sarina Ser, MD . ?Documentation: I have reviewed the above documentation for accuracy and completeness, and I agree with the above. ? ?Sarina Ser, MD ? ?

## 2021-05-24 ENCOUNTER — Encounter: Payer: Self-pay | Admitting: Dermatology

## 2021-07-30 DIAGNOSIS — Z1231 Encounter for screening mammogram for malignant neoplasm of breast: Secondary | ICD-10-CM | POA: Diagnosis not present

## 2021-10-01 DIAGNOSIS — E785 Hyperlipidemia, unspecified: Secondary | ICD-10-CM | POA: Diagnosis not present

## 2021-12-24 DIAGNOSIS — E785 Hyperlipidemia, unspecified: Secondary | ICD-10-CM | POA: Diagnosis not present

## 2021-12-24 DIAGNOSIS — E611 Iron deficiency: Secondary | ICD-10-CM | POA: Diagnosis not present

## 2021-12-31 DIAGNOSIS — Z636 Dependent relative needing care at home: Secondary | ICD-10-CM | POA: Diagnosis not present

## 2021-12-31 DIAGNOSIS — R232 Flushing: Secondary | ICD-10-CM | POA: Diagnosis not present

## 2021-12-31 DIAGNOSIS — Z0000 Encounter for general adult medical examination without abnormal findings: Secondary | ICD-10-CM | POA: Diagnosis not present

## 2021-12-31 DIAGNOSIS — Z124 Encounter for screening for malignant neoplasm of cervix: Secondary | ICD-10-CM | POA: Diagnosis not present

## 2021-12-31 DIAGNOSIS — M25552 Pain in left hip: Secondary | ICD-10-CM | POA: Diagnosis not present

## 2021-12-31 DIAGNOSIS — E785 Hyperlipidemia, unspecified: Secondary | ICD-10-CM | POA: Diagnosis not present

## 2021-12-31 DIAGNOSIS — M25551 Pain in right hip: Secondary | ICD-10-CM | POA: Diagnosis not present

## 2021-12-31 DIAGNOSIS — R29898 Other symptoms and signs involving the musculoskeletal system: Secondary | ICD-10-CM | POA: Diagnosis not present

## 2022-01-02 DIAGNOSIS — M25552 Pain in left hip: Secondary | ICD-10-CM | POA: Diagnosis not present

## 2022-01-02 DIAGNOSIS — M25551 Pain in right hip: Secondary | ICD-10-CM | POA: Diagnosis not present

## 2022-03-15 ENCOUNTER — Ambulatory Visit
Admission: EM | Admit: 2022-03-15 | Discharge: 2022-03-15 | Disposition: A | Discharge status: Home / Self Care | Payer: PPO | Attending: Urgent Care | Admitting: Urgent Care

## 2022-03-15 DIAGNOSIS — J0100 Acute maxillary sinusitis, unspecified: Secondary | ICD-10-CM | POA: Diagnosis not present

## 2022-03-15 MED ORDER — AZITHROMYCIN 250 MG PO TABS: ORAL_TABLET | ORAL | 0 refills | Status: AC

## 2022-03-15 MED ORDER — PREDNISONE 20 MG PO TABS: ORAL_TABLET | ORAL | 0 refills | Status: AC

## 2022-03-15 NOTE — ED Provider Notes (Signed)
[facility] Palau    CSN: [id number] Arrival date & time: 03/15/22  0806      History   Chief Complaint Chief Complaint  Patient presents with   Cough   Nasal Congestion   Facial Pain    HPI Lindsay Randall is a 67 y.o. female.   HPI  Patient presents to urgent care with symptoms x 4 days.  Symptoms include cough, congestion, sinus pressure.  Elevated body temperature of 99.9 F is recorded in clinic.  She denies documented fever at home, also denies myalgias and chills.  She states she feels a lot of pressure around her nose.  Past Medical History:  Diagnosis Date   Anemia    Hypercholesteremia     Patient Active Problem List   Diagnosis Date Noted   Hyperlipidemia, unspecified 04/09/2016    Past Surgical History:  Procedure Laterality Date   COLONOSCOPY WITH PROPOFOL N/A 03/07/2021   Procedure: COLONOSCOPY WITH PROPOFOL;  Surgeon: Annamaria Helling, DO;  Location: [facility] ENDOSCOPY;  Service: Gastroenterology;  Laterality: N/A;   WISDOM TOOTH EXTRACTION      OB History     Gravida  1   Para  1   Term  1   Preterm      AB      Living  1      SAB      IAB      Ectopic      Multiple      Live Births  1            Home Medications    Prior to Admission medications   Medication Sig Start Date End Date Taking? Authorizing Provider  estradiol (ESTRACE VAGINAL) 0.1 MG/GM vaginal cream Place 1 Applicatorful vaginally 2 (two) times a week. Patient not taking: Reported on 05/21/2020 08/25/19   Will Bonnet, MD  ezetimibe (ZETIA) 10 MG tablet Take 10 mg by mouth daily. 07/07/19   [provider]  Multiple Vitamins-Minerals (CENTRUM SILVER 50+WOMEN PO) Take by mouth.    [provider]  simvastatin (ZOCOR) 40 MG tablet Take 40 mg by mouth daily. 07/07/19   [provider]  vitamin B-12 (CYANOCOBALAMIN) 1000 MCG tablet Take 1,000 mcg by mouth daily.    [provider]    Family History Family  History  Problem Relation Age of Onset   Diabetes Mellitus II Mother    Heart disease Mother    Multiple sclerosis Father     Social History Social History   Tobacco Use   Smoking status: Never   Smokeless tobacco: Never  Vaping Use   Vaping Use: Never used  Substance Use Topics   Alcohol use: Yes    Comment: rare   Drug use: No     Allergies   Patient has no known allergies.   Review of Systems Review of Systems   Physical Exam Triage Vital Signs ED Triage Vitals  Enc Vitals Group     BP      Pulse      Resp      Temp      Temp src      SpO2      Weight      Height      Head Circumference      Peak Flow      Pain Score      Pain Loc      Pain Edu?      Excl. in [place]?  No data found.  Updated Vital Signs BP 127/75   Pulse 99   Temp 99.9 F (37.7 C)   Resp 17   SpO2 99%   Visual Acuity Right Eye Distance:   Left Eye Distance:   Bilateral Distance:    Right Eye Near:   Left Eye Near:    Bilateral Near:     Physical Exam Vitals reviewed.  Constitutional:      Appearance: Normal appearance.  HENT:     Nose: Congestion present.     Right Sinus: Maxillary sinus tenderness present.     Left Sinus: Maxillary sinus tenderness present.     Mouth/Throat:     Pharynx: Posterior oropharyngeal erythema present. No oropharyngeal exudate.  Cardiovascular:     Rate and Rhythm: Normal rate and regular rhythm.     Pulses: Normal pulses.     Heart sounds: Normal heart sounds.  Pulmonary:     Effort: Pulmonary effort is normal.     Breath sounds: Normal breath sounds.  Skin:    General: Skin is warm and dry.  Neurological:     General: No focal deficit present.     Mental Status: She is alert and oriented to person, place, and time.  Psychiatric:        Mood and Affect: Mood normal.        Behavior: Behavior normal.      UC Treatments / Results  Labs (all labs ordered are listed, but only abnormal results are displayed) Labs Reviewed - No  data to display  EKG   Radiology No results found.  Procedures Procedures (including critical care time)  Medications Ordered in UC Medications - No data to display  Initial Impression / Assessment and Plan / UC Course  I have reviewed the triage vital signs and the nursing notes.  Pertinent labs & imaging results that were available during my care of the patient were reviewed by me and considered in my medical decision making (see chart for details).   Patient is afebrile (elevated) here without recent antipyretics. Satting well on room air. Overall is well appearing, well hydrated, without respiratory distress. Pulmonary exam is unremarkable.  Lungs CTAB without wheezing, rhonchi, rales.  Mild pharyngeal erythema without peritonsillar exudates.  TMs are WNL bilaterally.  Maxillary sinus tenderness to palpation is present.  Patient's symptoms are consistent with an acute viral process.  Recommending use of OTC nasal decongestant such as Sudafed and Flonase.  Will prescribe a course of prednisone to promote anti-inflammatory action and sinus drainage.  Will prescribe a backup antibiotic in the event her symptoms do not resolve within the next 3 to 4 days.  Final Clinical Impressions(s) / UC Diagnoses   Final diagnoses:  None   Discharge Instructions   None    ED Prescriptions   None    PDMP not reviewed this encounter.   Rose Phi, [place] 03/15/22 [phone]

## 2022-03-15 NOTE — Discharge Instructions (Signed)
You have been diagnosed with a viral upper respiratory infection based on your symptoms and exam. Viral illnesses cannot be treated with antibiotics - they are self limiting - and you should find your symptoms resolving within a few days. Get plenty of rest and non-caffeinated fluids. Watch for signs of dehydration including reduced urine output and dark colored urine.  We recommend you use over-the-counter medications for symptom control including acetaminophen (Tylenol), ibuprofen (Advil/Motrin) or naproxen (Aleve) for throat pain, fever, chills or body aches. You may combine use of acetaminophen and ibuprofen/naproxen if needed.  Some patients find an pain-relieving throat spray such as Chloraseptic to be effective.  Also recommend cold/cough medication containing a cough suppressant such as dextromethorphan, as needed. Please note that some cough medications are not recommended if you suffer from hypertension.    Saline mist spray is helpful for removing excess mucus from your nose.  Room humidifiers are helpful to ease breathing at night. I recommend guaifenesin (Mucinex) with plenty of water throughout the day to help thin and loosen mucus secretions in your respiratory passages.   If appropriate based upon your other medical problems, you might also find relief of nasal/sinus congestion symptoms by using a nasal decongestant such as fluticasone (Flonase ) or pseudoephedrine (Sudafed sinus).  You will need to obtain Sudafed from behind the pharmacist counter.  Speak to the pharmacist to verify that you are not duplicating medications with other over-the-counter formulations that you may be using.   

## 2022-03-15 NOTE — ED Triage Notes (Signed)
Pt. Presents to UC w/ c/o  a cough, congestion and sinus pressure for the past 4 days.

## 2022-05-26 IMAGING — MR MR LUMBAR SPINE W/O CM
5 series · 31 of 48 positions shown · non-contrast
Comparison: June 23, 2018

CLINICAL DATA: Right lower extremity pain.  Bowel incontinence.

EXAM:
MRI LUMBAR SPINE WITHOUT CONTRAST
TECHNIQUE: Multiplanar, multisequence MR imaging of the lumbar spine was
performed. No intravenous contrast was administered.

[Series 5: T2 · sagittal · 4.0mm · 0.81mm/px · 6 of 15 slices shown (1 of 2)]
[im 1/15]
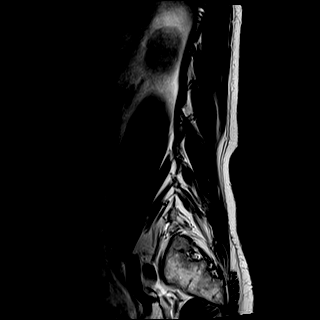
[im 3/15]
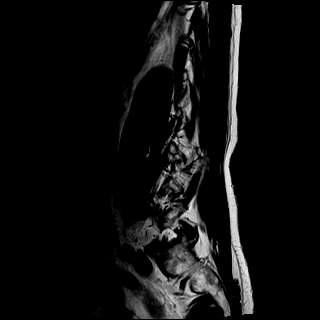
[im 6/15]
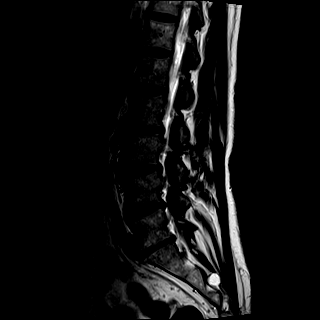
[im 9/15]
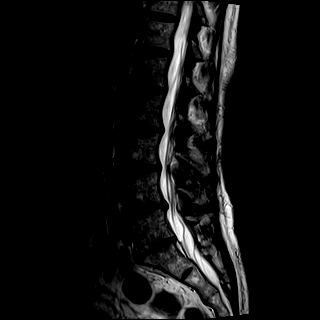
[im 12/15]
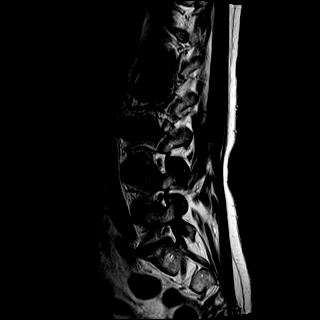
[im 15/15]
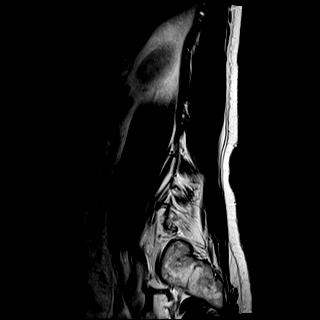

[Series 6: T1 · sagittal · 4.0mm · 0.81mm/px · 7 of 15 slices shown (1 of 2)]
[im 1/15]
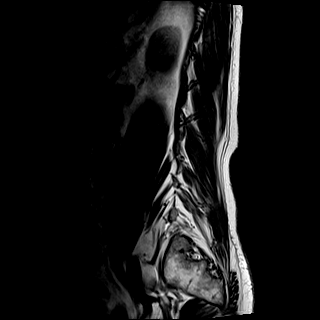
[im 3/15]
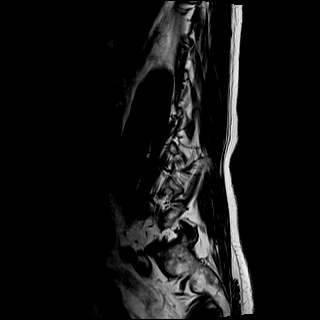
[im 5/15]
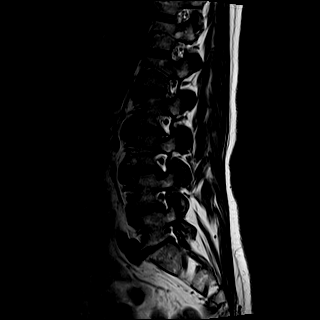
[im 8/15]
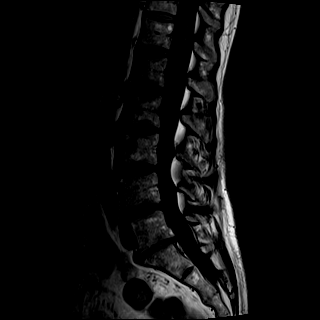
[im 10/15]
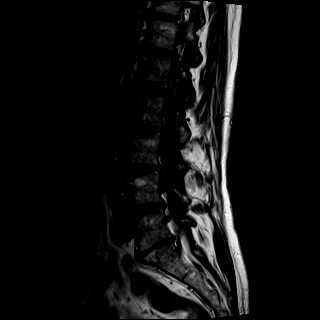
[im 12/15]
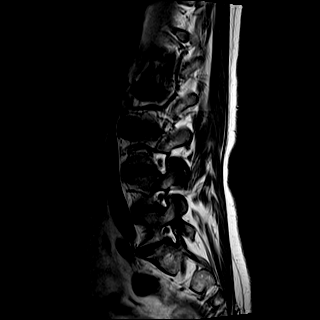
[im 15/15]
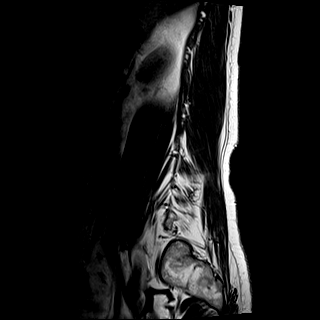

[Series 7: STIR · sagittal · 4.0mm · 0.41mm/px · 2 of 15 slices shown]
[im 1/15]
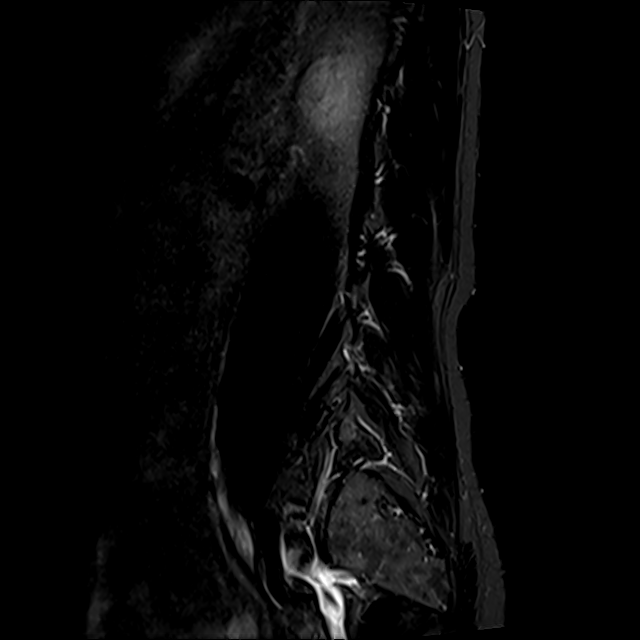
[im 3/15]
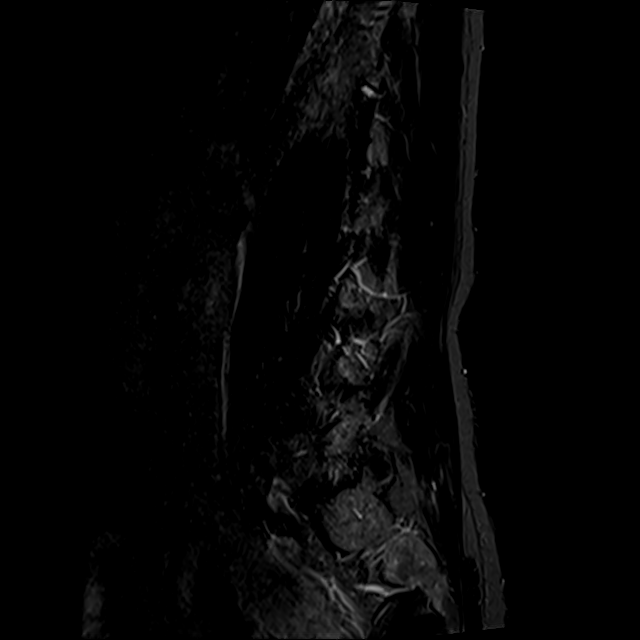

[Series 8: T2 · axial · 4.0mm · 0.78mm/px · z∈[-111,+83]mm · 8 of 32 slices shown (2 of 2)]
[im 1/32]
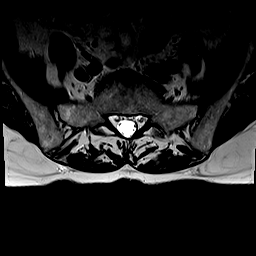
[im 5/32]
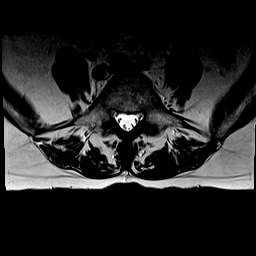
[im 10/32]
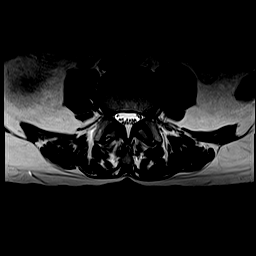
[im 15/32]
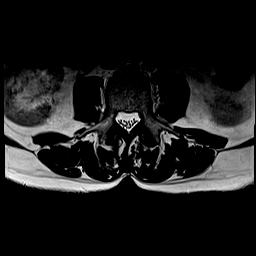
[im 17/32]
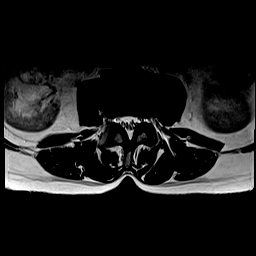
[im 22/32]
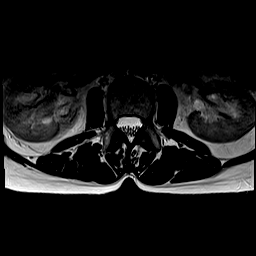
[im 27/32]
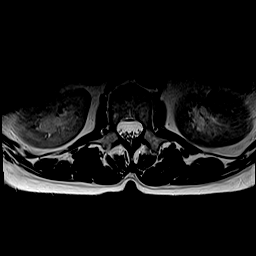
[im 32/32]
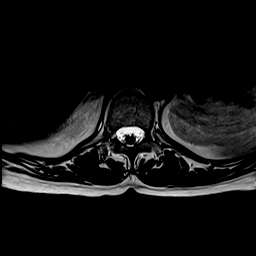

[Series 9: T1 · axial · 4.0mm · 0.39mm/px · z∈[-111,+83]mm · 8 of 32 slices shown (2 of 2)]
[im 1/32]
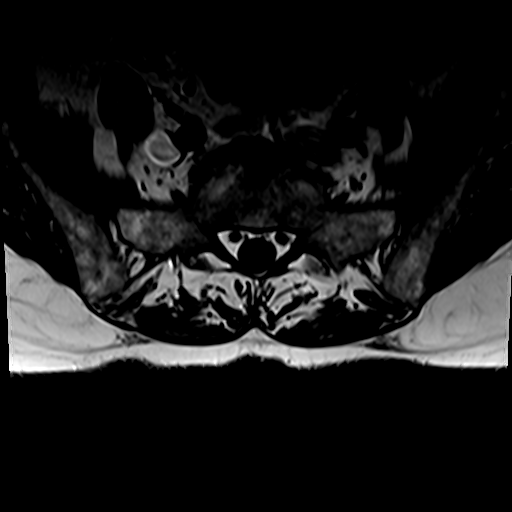
[im 5/32]
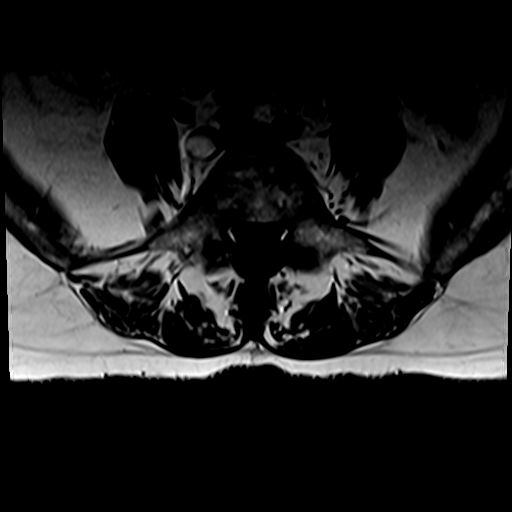
[im 10/32]
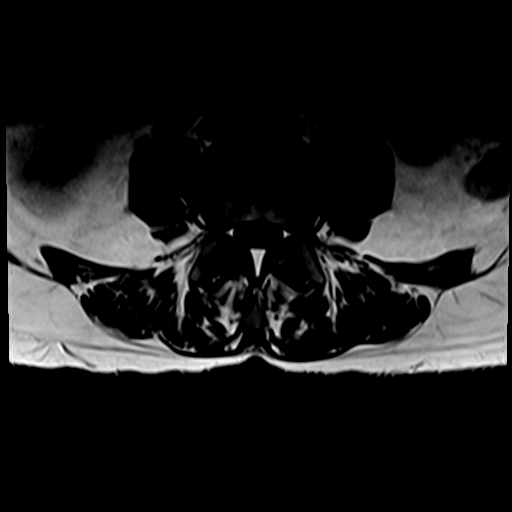
[im 15/32]
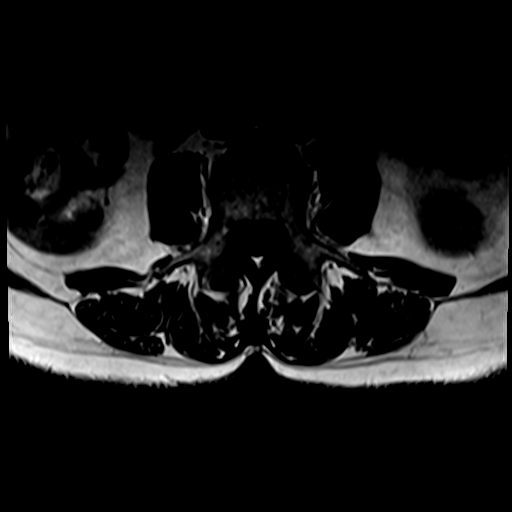
[im 17/32]
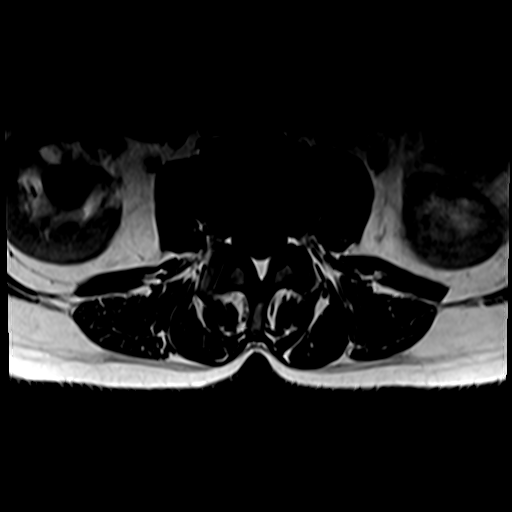
[im 22/32]
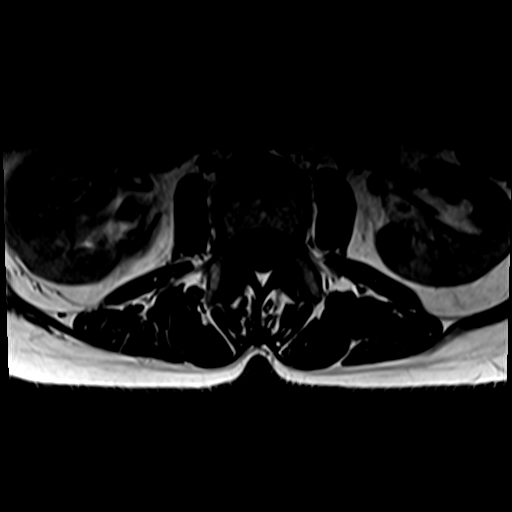
[im 27/32]
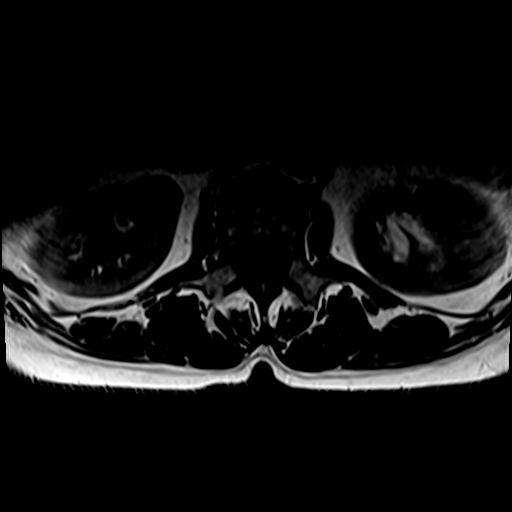
[im 32/32]
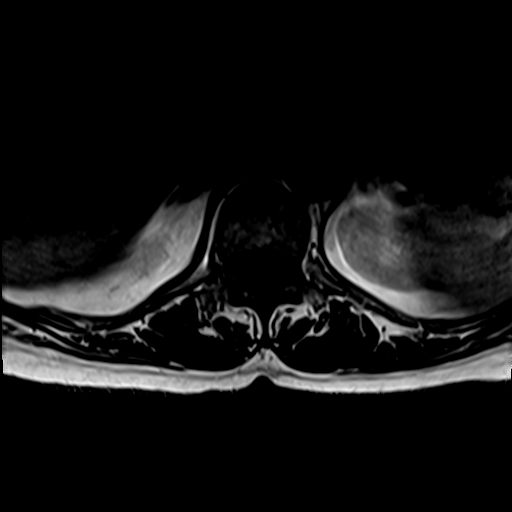

[31 of 48 positions shown; findings below may reference images not displayed]

FINDINGS: Segmentation:  Standard.

Alignment:  Physiologic.

Vertebrae:  No fracture, evidence of discitis, or bone lesion.

Conus medullaris and cauda equina: Conus extends to the L1 level.
Conus and cauda equina appear normal.

Paraspinal and other soft tissues: Negative.

Disc levels:

T12-L1: Normal disc space and facets. No spinal canal or
neuroforaminal stenosis.

L1-L2: Normal disc space and facets. No spinal canal or
neuroforaminal stenosis.

L2-L3: Disc desiccation without herniation. Minimal disc bulge. No
spinal canal or neural foraminal stenosis.

L3-L4: Small disc bulge and mild facet hypertrophy. No spinal canal
or neural foraminal stenosis.

L4-L5: Partial involution of right subarticular disc protrusion.
Mild disc bulge. Mild right foraminal stenosis is unchanged. No
spinal canal stenosis centrally. Mild narrowing of both lateral
recesses.

L5-S1: Disc space narrowing without focal herniation. No spinal
canal or neural foraminal stenosis.

Visualized sacrum: Normal.
IMPRESSION: 1. Unchanged mild right L4-L5 neural foraminal stenosis and mild
bilateral lateral recess stenosis.
2. No lumbar spinal canal stenosis.

## 2022-05-29 ENCOUNTER — Ambulatory Visit (INDEPENDENT_AMBULATORY_CARE_PROVIDER_SITE_OTHER): Payer: PPO | Admitting: Dermatology

## 2022-05-29 VITALS — BP 121/76

## 2022-05-29 DIAGNOSIS — Z1283 Encounter for screening for malignant neoplasm of skin: Secondary | ICD-10-CM

## 2022-05-29 DIAGNOSIS — D229 Melanocytic nevi, unspecified: Secondary | ICD-10-CM | POA: Diagnosis not present

## 2022-05-29 DIAGNOSIS — L853 Xerosis cutis: Secondary | ICD-10-CM

## 2022-05-29 DIAGNOSIS — L820 Inflamed seborrheic keratosis: Secondary | ICD-10-CM | POA: Diagnosis not present

## 2022-05-29 DIAGNOSIS — L821 Other seborrheic keratosis: Secondary | ICD-10-CM | POA: Diagnosis not present

## 2022-05-29 DIAGNOSIS — L814 Other melanin hyperpigmentation: Secondary | ICD-10-CM | POA: Diagnosis not present

## 2022-05-29 DIAGNOSIS — L2089 Other atopic dermatitis: Secondary | ICD-10-CM | POA: Diagnosis not present

## 2022-05-29 DIAGNOSIS — Z79899 Other long term (current) drug therapy: Secondary | ICD-10-CM | POA: Diagnosis not present

## 2022-05-29 DIAGNOSIS — D1801 Hemangioma of skin and subcutaneous tissue: Secondary | ICD-10-CM

## 2022-05-29 DIAGNOSIS — L578 Other skin changes due to chronic exposure to nonionizing radiation: Secondary | ICD-10-CM | POA: Diagnosis not present

## 2022-05-29 DIAGNOSIS — L304 Erythema intertrigo: Secondary | ICD-10-CM | POA: Diagnosis not present

## 2022-05-29 MED ORDER — MOMETASONE FUROATE 0.1 % EX CREA: 1.0000 | TOPICAL_CREAM | Freq: Every day | CUTANEOUS | 2 refills | Status: AC | PRN

## 2022-05-29 NOTE — Patient Instructions (Signed)
Cryotherapy Aftercare  Wash gently with soap and water everyday.   Apply Vaseline and Band-Aid daily until healed.     Due to recent changes in healthcare laws, you may see results of your pathology and/or laboratory studies on MyChart before the doctors have had a chance to review them. We understand that in some cases there may be results that are confusing or concerning to you. Please understand that not all results are received at the same time and often the doctors may need to interpret multiple results in order to provide you with the best plan of care or course of treatment. Therefore, we ask that you please give us 2 business days to thoroughly review all your results before contacting the office for clarification. Should we see a critical lab result, you will be contacted sooner.   If You Need Anything After Your Visit  If you have any questions or concerns for your doctor, please call our main line at [phone] and press option 4 to reach your doctor's medical assistant. If no one answers, please leave a voicemail as directed and we will return your call as soon as possible. Messages left after 4 pm will be answered the following business day.   You may also send us a message via MyChart. We typically respond to MyChart messages within 1-2 business days.  For prescription refills, please ask your pharmacy to contact our office. Our fax number is [phone].  If you have an urgent issue when the clinic is closed that cannot wait until the next business day, you can page your doctor at the number below.    Please note that while we do our best to be available for urgent issues outside of office hours, we are not available 24/7.   If you have an urgent issue and are unable to reach us, you may choose to seek medical care at your doctor's office, retail clinic, urgent care center, or emergency room.  If you have a medical emergency, please immediately call 911 or go to the  emergency department.  Pager Numbers  - Dr. Kowalski: [phone]  - Dr. Moye: [phone]  - Dr. Stewart: [phone]  In the event of inclement weather, please call our main line at [phone] for an update on the status of any delays or closures.  Dermatology Medication Tips: Please keep the boxes that topical medications come in in order to help keep track of the instructions about where and how to use these. Pharmacies typically print the medication instructions only on the boxes and not directly on the medication tubes.   If your medication is too expensive, please contact our office at [phone] option 4 or send us a message through MyChart.   We are unable to tell what your co-pay for medications will be in advance as this is different depending on your insurance coverage. However, we may be able to find a substitute medication at lower cost or fill out paperwork to get insurance to cover a needed medication.   If a prior authorization is required to get your medication covered by your insurance company, please allow us 1-2 business days to complete this process.  Drug prices often vary depending on where the prescription is filled and some pharmacies may offer cheaper prices.  The website www.goodrx.com contains coupons for medications through different pharmacies. The prices here do not account for what the cost may be with help from insurance (it may be cheaper with your insurance), but the website can   give you the price if you did not use any insurance.  - You can print the associated coupon and take it with your prescription to the pharmacy.  - You may also stop by our office during regular business hours and pick up a GoodRx coupon card.  - If you need your prescription sent electronically to a different pharmacy, notify our office through [facility] MyChart or by phone at [phone] option 4.     Si Usted Necesita Algo Despus de Su Visita  Tambin puede  enviarnos un mensaje a travs de MyChart. Por lo general respondemos a los mensajes de MyChart en el transcurso de 1 a 2 das hbiles.  Para renovar recetas, por favor pida a su farmacia que se ponga en contacto con nuestra oficina. Nuestro nmero de fax es el [phone].  Si tiene un asunto urgente cuando la clnica est cerrada y que no puede esperar hasta el siguiente da hbil, puede llamar/localizar a su doctor(a) al nmero que aparece a continuacin.   Por favor, tenga en cuenta que aunque hacemos todo lo posible para estar disponibles para asuntos urgentes fuera del horario de oficina, no estamos disponibles las 24 horas del da, los 7 das de la semana.   Si tiene un problema urgente y no puede comunicarse con nosotros, puede optar por buscar atencin mdica  en el consultorio de su doctor(a), en una clnica privada, en un centro de atencin urgente o en una sala de emergencias.  Si tiene una emergencia mdica, por favor llame inmediatamente al 911 o vaya a la sala de emergencias.  Nmeros de bper  - Dr. Kowalski: [phone]  - Dra. Moye: [phone]  - Dra. Stewart: [phone]  En caso de inclemencias del tiempo, por favor llame a nuestra lnea principal al [phone] para una actualizacin sobre el estado de cualquier retraso o cierre.  Consejos para la medicacin en dermatologa: Por favor, guarde las cajas en las que vienen los medicamentos de uso tpico para ayudarle a seguir las instrucciones sobre dnde y cmo usarlos. Las farmacias generalmente imprimen las instrucciones del medicamento slo en las cajas y no directamente en los tubos del medicamento.   Si su medicamento es muy caro, por favor, pngase en contacto con nuestra oficina llamando al [phone] y presione la opcin 4 o envenos un mensaje a travs de MyChart.   No podemos decirle cul ser su copago por los medicamentos por adelantado ya que esto es diferente dependiendo de la cobertura de su seguro.  Sin embargo, es posible que podamos encontrar un medicamento sustituto a menor costo o llenar un formulario para que el seguro cubra el medicamento que se considera necesario.   Si se requiere una autorizacin previa para que su compaa de seguros cubra su medicamento, por favor permtanos de 1 a 2 das hbiles para completar este proceso.  Los precios de los medicamentos varan con frecuencia dependiendo del lugar de dnde se surte la receta y alguna farmacias pueden ofrecer precios ms baratos.  El sitio web www.goodrx.com tiene cupones para medicamentos de diferentes farmacias. Los precios aqu no tienen en cuenta lo que podra costar con la ayuda del seguro (puede ser ms barato con su seguro), pero el sitio web puede darle el precio si no utiliz ningn seguro.  - Puede imprimir el cupn correspondiente y llevarlo con su receta a la farmacia.  - Tambin puede pasar por nuestra oficina durante el horario de atencin regular y recoger una tarjeta de cupones de GoodRx.  -   Si necesita que su receta se enve electrnicamente a una farmacia diferente, informe a nuestra oficina a travs de MyChart de [facility] o por telfono llamando al [phone] y presione la opcin 4.  

## 2022-05-29 NOTE — Progress Notes (Signed)
Follow-Up Visit   Subjective  Lindsay Randall is a 67 y.o. female who presents for the following: Skin Cancer Screening and Full Body Skin Exam  The patient presents for Total-Body Skin Exam (TBSE) for skin cancer screening and mole check. The patient has spots, moles and lesions to be evaluated, some may be new or changing and the patient has concerns that these could be cancer.    The following portions of the chart were reviewed this encounter and updated as appropriate: medications, allergies, medical history  Review of Systems:  No other skin or systemic complaints except as noted in HPI or Assessment and Plan.  Objective  Well appearing patient in no apparent distress; mood and affect are within normal limits.  A full examination was performed including scalp, head, eyes, ears, nose, lips, neck, chest, axillae, abdomen, back, buttocks, bilateral upper extremities, bilateral lower extremities, hands, feet, fingers, toes, fingernails, and toenails. All findings within normal limits unless otherwise noted below.   Relevant physical exam findings are noted in the Assessment and Plan.    Assessment & Plan   LENTIGINES, SEBORRHEIC KERATOSES, HEMANGIOMAS - Benign normal skin lesions - Benign-appearing - Call for any changes  MELANOCYTIC NEVI - Tan-brown and/or pink-flesh-colored symmetric macules and papules - Benign appearing on exam today - Observation - Call clinic for new or changing moles - Recommend daily use of broad spectrum spf 30+ sunscreen to sun-exposed areas.  - Light brown regular macule of right mid sole.       ACTINIC DAMAGE - Chronic condition, secondary to cumulative UV/sun exposure - diffuse scaly erythematous macules with underlying dyspigmentation - Recommend daily broad spectrum sunscreen SPF 30+ to sun-exposed areas, reapply every 2 hours as needed.  - Staying in the shade or wearing long sleeves, sun glasses (UVA+UVB protection) and wide brim  hats (4-inch brim around the entire circumference of the hat) are also recommended for sun protection.  - Call for new or changing lesions.  SKIN CANCER SCREENING PERFORMED TODAY.  ATOPIC DERMATITIS Exam: Pinkness of left neck  Atopic dermatitis (eczema) is a chronic, relapsing, pruritic condition that can significantly affect quality of life. It is often associated with allergic rhinitis and/or asthma and can require treatment with topical medications, phototherapy, or in severe cases biologic injectable medication (Dupixent; Adbry) or Oral JAK inhibitors.  Treatment Plan: Mometasone cream qd up to 5 days per week as needed. Recommend Cerave cream daily.  Recommend gentle skin care.  Xerosis - diffuse xerotic patches - recommend gentle, hydrating skin care - gentle skin care handout given  INTERTRIGO Exam Erythematous macerated patches of inframammary areas  Intertrigo is a chronic recurrent rash that occurs in skin fold areas that may be associated with friction; heat; moisture; yeast; fungus; and bacteria.  It is exacerbated by increased movement / activity; sweating; and higher atmospheric temperature.  Treatment Plan Mometasone cream qd up to 5 days per week  INFLAMED SEBORRHEIC KERATOSIS Exam: Erythematous keratotic or waxy stuck-on papule or plaque.  Symptomatic, irritating, patient would like treated.  Benign-appearing.  Call clinic for new or changing lesions.   Prior to procedure, discussed risks of blister formation, small wound, skin dyspigmentation, or rare scar following treatment. Recommend Vaseline ointment to treated areas while healing.  Destruction Procedure Note Destruction method: cryotherapy   Informed consent: discussed and consent obtained   Lesion destroyed using liquid nitrogen: Yes   Outcome: patient tolerated procedure well with no complications   Post-procedure details: wound care instructions given  Locations: Left neck # of Lesions Treated:  8   Return in about 1 year (around 05/29/2023) for TBSE.  I, Ashok Cordia, CMA, am acting as scribe for Sarina Ser, MD .   Documentation: I have reviewed the above documentation for accuracy and completeness, and I agree with the above.  Sarina Ser, MD

## 2022-06-03 ENCOUNTER — Encounter: Payer: Self-pay | Admitting: Dermatology

## 2022-08-05 DIAGNOSIS — Z1231 Encounter for screening mammogram for malignant neoplasm of breast: Secondary | ICD-10-CM | POA: Diagnosis not present

## 2022-10-17 DIAGNOSIS — E538 Deficiency of other specified B group vitamins: Secondary | ICD-10-CM | POA: Diagnosis not present

## 2022-10-17 DIAGNOSIS — D509 Iron deficiency anemia, unspecified: Secondary | ICD-10-CM | POA: Diagnosis not present

## 2022-10-17 DIAGNOSIS — M1990 Unspecified osteoarthritis, unspecified site: Secondary | ICD-10-CM | POA: Diagnosis not present

## 2022-10-17 DIAGNOSIS — E785 Hyperlipidemia, unspecified: Secondary | ICD-10-CM | POA: Diagnosis not present

## 2022-10-17 DIAGNOSIS — G8929 Other chronic pain: Secondary | ICD-10-CM | POA: Diagnosis not present

## 2022-10-17 DIAGNOSIS — E559 Vitamin D deficiency, unspecified: Secondary | ICD-10-CM | POA: Diagnosis not present

## 2022-10-17 DIAGNOSIS — R32 Unspecified urinary incontinence: Secondary | ICD-10-CM | POA: Diagnosis not present

## 2023-01-01 DIAGNOSIS — E785 Hyperlipidemia, unspecified: Secondary | ICD-10-CM | POA: Diagnosis not present

## 2023-01-08 DIAGNOSIS — Z1331 Encounter for screening for depression: Secondary | ICD-10-CM | POA: Diagnosis not present

## 2023-01-08 DIAGNOSIS — M791 Myalgia, unspecified site: Secondary | ICD-10-CM | POA: Diagnosis not present

## 2023-01-08 DIAGNOSIS — E785 Hyperlipidemia, unspecified: Secondary | ICD-10-CM | POA: Diagnosis not present

## 2023-01-08 DIAGNOSIS — Z0000 Encounter for general adult medical examination without abnormal findings: Secondary | ICD-10-CM | POA: Diagnosis not present

## 2023-01-08 DIAGNOSIS — Z23 Encounter for immunization: Secondary | ICD-10-CM | POA: Diagnosis not present

## 2023-01-08 DIAGNOSIS — R030 Elevated blood-pressure reading, without diagnosis of hypertension: Secondary | ICD-10-CM | POA: Diagnosis not present

## 2023-01-08 DIAGNOSIS — Z1159 Encounter for screening for other viral diseases: Secondary | ICD-10-CM | POA: Diagnosis not present

## 2023-01-08 DIAGNOSIS — L731 Pseudofolliculitis barbae: Secondary | ICD-10-CM | POA: Diagnosis not present

## 2023-01-08 DIAGNOSIS — J3489 Other specified disorders of nose and nasal sinuses: Secondary | ICD-10-CM | POA: Diagnosis not present

## 2023-01-08 DIAGNOSIS — R29898 Other symptoms and signs involving the musculoskeletal system: Secondary | ICD-10-CM | POA: Diagnosis not present

## 2023-04-01 DIAGNOSIS — E785 Hyperlipidemia, unspecified: Secondary | ICD-10-CM | POA: Diagnosis not present

## 2023-05-28 DIAGNOSIS — E785 Hyperlipidemia, unspecified: Secondary | ICD-10-CM | POA: Diagnosis not present

## 2023-05-28 DIAGNOSIS — R030 Elevated blood-pressure reading, without diagnosis of hypertension: Secondary | ICD-10-CM | POA: Diagnosis not present

## 2023-05-28 DIAGNOSIS — M48061 Spinal stenosis, lumbar region without neurogenic claudication: Secondary | ICD-10-CM | POA: Diagnosis not present

## 2023-06-04 ENCOUNTER — Encounter: Payer: PPO | Admitting: Dermatology

## 2023-06-15 DIAGNOSIS — L2989 Other pruritus: Secondary | ICD-10-CM | POA: Diagnosis not present

## 2023-06-15 DIAGNOSIS — R202 Paresthesia of skin: Secondary | ICD-10-CM | POA: Diagnosis not present

## 2023-06-15 DIAGNOSIS — L858 Other specified epidermal thickening: Secondary | ICD-10-CM | POA: Diagnosis not present

## 2023-06-15 DIAGNOSIS — D2261 Melanocytic nevi of right upper limb, including shoulder: Secondary | ICD-10-CM | POA: Diagnosis not present

## 2023-06-15 DIAGNOSIS — D2262 Melanocytic nevi of left upper limb, including shoulder: Secondary | ICD-10-CM | POA: Diagnosis not present

## 2023-06-15 DIAGNOSIS — D225 Melanocytic nevi of trunk: Secondary | ICD-10-CM | POA: Diagnosis not present

## 2023-06-15 DIAGNOSIS — L821 Other seborrheic keratosis: Secondary | ICD-10-CM | POA: Diagnosis not present

## 2023-06-15 DIAGNOSIS — D2272 Melanocytic nevi of left lower limb, including hip: Secondary | ICD-10-CM | POA: Diagnosis not present

## 2023-06-15 DIAGNOSIS — D2271 Melanocytic nevi of right lower limb, including hip: Secondary | ICD-10-CM | POA: Diagnosis not present

## 2023-08-06 DIAGNOSIS — Z1231 Encounter for screening mammogram for malignant neoplasm of breast: Secondary | ICD-10-CM | POA: Diagnosis not present

## 2023-10-13 ENCOUNTER — Ambulatory Visit: Admission: EM | Admit: 2023-10-13 | Discharge: 2023-10-13 | Disposition: A | Discharge status: Home / Self Care

## 2023-10-13 DIAGNOSIS — U071 COVID-19: Secondary | ICD-10-CM

## 2023-10-13 LAB — POC SOFIA SARS ANTIGEN FIA: SARS Coronavirus 2 Ag: POSITIVE — AB

## 2023-10-13 MED ORDER — ONDANSETRON 4 MG PO TBDP: 4.0000 mg | ORAL_TABLET | Freq: Three times a day (TID) | ORAL | 0 refills | Status: AC | PRN

## 2023-10-13 NOTE — ED Provider Notes (Signed)
 [name]    CSN: [id number] Arrival date & time: 10/13/23  1046      History   Chief Complaint Chief Complaint  Patient presents with   Emesis   Facial Pain    HPI Lindsay Randall is a 68 y.o. female.  Patient presents with cough x 1 week.  She has fatigue, nausea x 1 day and 1 episode of emesis this morning.  She denies fever, shortness of breath, abdominal pain, diarrhea.  She has been treating her symptoms with Tylenol.  The history is provided by the patient and medical records.    Past Medical History:  Diagnosis Date   Anemia    Hypercholesteremia     Patient Active Problem List   Diagnosis Date Noted   Hyperlipidemia, unspecified 04/09/2016    Past Surgical History:  Procedure Laterality Date   COLONOSCOPY WITH PROPOFOL  N/A 03/07/2021   Procedure: COLONOSCOPY WITH PROPOFOL ;  Surgeon: Onita Elspeth Sharper, DO;  Location: [facility] ENDOSCOPY;  Service: Gastroenterology;  Laterality: N/A;   WISDOM TOOTH EXTRACTION      OB History     Gravida  1   Para  1   Term  1   Preterm      AB      Living  1      SAB      IAB      Ectopic      Multiple      Live Births  1            Home Medications    Prior to Admission medications   Medication Sig Start Date End Date Taking? Authorizing Provider  ondansetron  (ZOFRAN -ODT) 4 MG disintegrating tablet Take 1 tablet (4 mg total) by mouth every 8 (eight) hours as needed for nausea or vomiting. 10/13/23  Yes Corlis Burnard DEL, NP  azithromycin  (ZITHROMAX  Z-PAK) 250 MG tablet Take 2 tablets (500 mg) today, then 1 tablet (250 mg) for next 4 days. Patient not taking: Reported on 10/13/2023 03/15/22   Immordino, Garnette, FNP  estradiol  (ESTRACE  VAGINAL) 0.1 MG/GM vaginal cream Place 1 Applicatorful vaginally 2 (two) times a week. Patient not taking: Reported on 05/21/2020 08/25/19   Leonce Garnette BIRCH, MD  ezetimibe (ZETIA) 10 MG tablet Take 10 mg by mouth daily. 07/07/19   [provider]   mometasone  (ELOCON ) 0.1 % cream Apply 1 Application topically daily as needed (Rash). Up to 5 days per week 05/29/22   Hester Alm BROCKS, MD  Multiple Vitamins-Minerals (CENTRUM SILVER 50+WOMEN PO) Take by mouth.    [provider]  rosuvastatin (CRESTOR) 5 MG tablet Take 5 mg by mouth daily.    [provider]  simvastatin (ZOCOR) 40 MG tablet Take 40 mg by mouth daily. 07/07/19   [provider]  vitamin B-12 (CYANOCOBALAMIN) 1000 MCG tablet Take 1,000 mcg by mouth daily.    [provider]    Family History Family History  Problem Relation Age of Onset   Diabetes Mellitus II Mother    Heart disease Mother    Multiple sclerosis Father     Social History Social History   Tobacco Use   Smoking status: Never   Smokeless tobacco: Never  Vaping Use   Vaping status: Never Used  Substance Use Topics   Alcohol use: Yes    Comment: rare   Drug use: No     Allergies   Patient has no known allergies.   Review of Systems Review of Systems  Constitutional:  Positive for fatigue. Negative for chills and fever.  HENT:  Positive for congestion. Negative for ear pain and sore throat.   Respiratory:  Positive for cough. Negative for shortness of breath.   Cardiovascular:  Negative for chest pain and palpitations.  Gastrointestinal:  Positive for nausea and vomiting. Negative for abdominal pain and diarrhea.     Physical Exam Triage Vital Signs ED Triage Vitals  Encounter Vitals Group     BP 10/13/23 1154 (!) 144/83     Girls Systolic BP Percentile --      Girls Diastolic BP Percentile --      Boys Systolic BP Percentile --      Boys Diastolic BP Percentile --      Pulse Rate 10/13/23 1154 96     Resp 10/13/23 1154 18     Temp 10/13/23 1154 97.7 F (36.5 C)     Temp src --      SpO2 10/13/23 1154 98 %     Weight --      Height --      Head Circumference --      Peak Flow --      Pain Score 10/13/23 1151 2     Pain Loc --      Pain  Education --      Exclude from Growth Chart --    No data found.  Updated Vital Signs BP (!) 144/83   Pulse 96   Temp 97.7 F (36.5 C)   Resp 18   SpO2 98%   Visual Acuity Right Eye Distance:   Left Eye Distance:   Bilateral Distance:    Right Eye Near:   Left Eye Near:    Bilateral Near:     Physical Exam Constitutional:      General: She is not in acute distress. HENT:     Right Ear: Tympanic membrane normal.     Left Ear: Tympanic membrane normal.     Nose: Nose normal.     Mouth/Throat:     Mouth: Mucous membranes are moist.     Pharynx: Oropharynx is clear.  Cardiovascular:     Rate and Rhythm: Normal rate and regular rhythm.     Heart sounds: Normal heart sounds.  Pulmonary:     Effort: Pulmonary effort is normal. No respiratory distress.     Breath sounds: Normal breath sounds.  Abdominal:     General: Bowel sounds are normal.     Palpations: Abdomen is soft.     Tenderness: There is no abdominal tenderness. There is no guarding or rebound.  Neurological:     Mental Status: She is alert.      UC Treatments / Results  Labs (all labs ordered are listed, but only abnormal results are displayed) Labs Reviewed  POC SOFIA SARS ANTIGEN FIA - Abnormal; Notable for the following components:      Result Value   SARS Coronavirus 2 Ag Positive (*)    All other components within normal limits    EKG   Radiology No results found.  Procedures Procedures (including critical care time)  Medications Ordered in UC Medications - No data to display  Initial Impression / Assessment and Plan / UC Course  I have reviewed the triage vital signs and the nursing notes.  Pertinent labs & imaging results that were available during my care of the patient were reviewed by me and considered in my medical decision making (see chart for details).  COVID-19.  Afebrile and vital signs are stable.  Rapid COVID positive.  Patient declines antiviral treatment.  Discussed  symptomatic treatment including Zofran  as needed for nausea and vomiting, rest, hydration, Tylenol as needed.  Education provided on COVID.  ED precautions discussed.  Instructed patient to follow-up with her PCP.  She agrees to plan of care.  Final Clinical Impressions(s) / UC Diagnoses   Final diagnoses:  COVID-19     Discharge Instructions      Your COVID test is positive.    Take the antinausea medication as directed.  Keep yourself hydrated with clear liquids, such as water.    Go to the emergency department if you have worsening symptoms.    Follow up with your primary care provider.          ED Prescriptions     Medication Sig Dispense Auth. Provider   ondansetron  (ZOFRAN -ODT) 4 MG disintegrating tablet Take 1 tablet (4 mg total) by mouth every 8 (eight) hours as needed for nausea or vomiting. 20 tablet Corlis Burnard DEL, NP      PDMP not reviewed this encounter.   Corlis Burnard DEL, NP 10/13/23 1245

## 2023-10-13 NOTE — ED Triage Notes (Signed)
 Patient to Urgent Care with complaints of cough/ sinus pain/ drainage/ nausea/ one episode of emesis/ fatigue and weakness.   Symptoms started yesterday. Cough that started last week.

## 2023-10-13 NOTE — Discharge Instructions (Addendum)
 Your COVID test is positive.    Take the antinausea medication as directed.  Keep yourself hydrated with clear liquids, such as water.    Go to the emergency department if you have worsening symptoms.    Follow up with your primary care provider.

## 2023-10-19 ENCOUNTER — Encounter: Payer: Self-pay | Admitting: Family Medicine

## 2023-10-19 ENCOUNTER — Other Ambulatory Visit: Payer: Self-pay | Admitting: Family Medicine

## 2023-10-19 DIAGNOSIS — R1032 Left lower quadrant pain: Secondary | ICD-10-CM

## 2023-10-21 ENCOUNTER — Ambulatory Visit
Admission: RE | Admit: 2023-10-21 | Discharge: 2023-10-21 | Disposition: A | Discharge status: Home / Self Care | Source: Ambulatory Visit | Attending: Family Medicine | Admitting: Family Medicine

## 2023-10-21 DIAGNOSIS — R1032 Left lower quadrant pain: Secondary | ICD-10-CM | POA: Insufficient documentation

## 2024-01-05 DIAGNOSIS — R030 Elevated blood-pressure reading, without diagnosis of hypertension: Secondary | ICD-10-CM | POA: Diagnosis not present

## 2024-01-05 DIAGNOSIS — E785 Hyperlipidemia, unspecified: Secondary | ICD-10-CM | POA: Diagnosis not present

## 2024-01-12 DIAGNOSIS — E785 Hyperlipidemia, unspecified: Secondary | ICD-10-CM | POA: Diagnosis not present

## 2024-01-12 DIAGNOSIS — Z0000 Encounter for general adult medical examination without abnormal findings: Secondary | ICD-10-CM | POA: Diagnosis not present

## 2024-01-12 DIAGNOSIS — R29898 Other symptoms and signs involving the musculoskeletal system: Secondary | ICD-10-CM | POA: Diagnosis not present

## 2024-01-12 DIAGNOSIS — R946 Abnormal results of thyroid function studies: Secondary | ICD-10-CM | POA: Diagnosis not present

## 2024-01-15 ENCOUNTER — Other Ambulatory Visit: Payer: Self-pay | Admitting: Family Medicine

## 2024-01-15 DIAGNOSIS — E785 Hyperlipidemia, unspecified: Secondary | ICD-10-CM

## 2024-01-18 ENCOUNTER — Ambulatory Visit
Admission: RE | Admit: 2024-01-18 | Discharge: 2024-01-18 | Disposition: A | Discharge status: Home / Self Care | Payer: Self-pay | Source: Ambulatory Visit | Attending: Family Medicine | Admitting: Family Medicine

## 2024-01-18 DIAGNOSIS — E785 Hyperlipidemia, unspecified: Secondary | ICD-10-CM | POA: Insufficient documentation
